# Patient Record
Sex: Female | Born: 1935 | Hispanic: Yes | Smoking: Never smoker
Health system: Southern US, Community
[De-identification: ages and names within clinical notes are randomized; demographics above are authoritative.]

## PROBLEM LIST (undated history)

## (undated) DIAGNOSIS — R51 Headache: Secondary | ICD-10-CM

## (undated) DIAGNOSIS — E785 Hyperlipidemia, unspecified: Secondary | ICD-10-CM

## (undated) DIAGNOSIS — I10 Essential (primary) hypertension: Secondary | ICD-10-CM

## (undated) DIAGNOSIS — M1990 Unspecified osteoarthritis, unspecified site: Secondary | ICD-10-CM

## (undated) HISTORY — DX: Unspecified osteoarthritis, unspecified site: M19.90

## (undated) HISTORY — PX: HUMERUS FRACTURE SURGERY: SHX670

## (undated) HISTORY — DX: Hyperlipidemia, unspecified: E78.5

## (undated) HISTORY — PX: CHOLECYSTECTOMY: SHX55

## (undated) HISTORY — PX: ABDOMINAL HYSTERECTOMY: SHX81

## (undated) HISTORY — DX: Essential (primary) hypertension: I10

## (undated) HISTORY — DX: Headache: R51

---

## 2009-05-18 ENCOUNTER — Emergency Department: Payer: Self-pay | Admitting: Emergency Medicine

## 2009-05-28 ENCOUNTER — Emergency Department: Payer: Self-pay | Admitting: Emergency Medicine

## 2011-10-31 ENCOUNTER — Encounter: Payer: Self-pay | Admitting: Internal Medicine

## 2011-10-31 ENCOUNTER — Ambulatory Visit (INDEPENDENT_AMBULATORY_CARE_PROVIDER_SITE_OTHER): Payer: Self-pay | Admitting: Internal Medicine

## 2011-10-31 DIAGNOSIS — R002 Palpitations: Secondary | ICD-10-CM

## 2011-10-31 DIAGNOSIS — R1032 Left lower quadrant pain: Secondary | ICD-10-CM

## 2011-10-31 DIAGNOSIS — E785 Hyperlipidemia, unspecified: Secondary | ICD-10-CM

## 2011-10-31 DIAGNOSIS — I10 Essential (primary) hypertension: Secondary | ICD-10-CM

## 2011-10-31 DIAGNOSIS — R42 Dizziness and giddiness: Secondary | ICD-10-CM

## 2011-10-31 DIAGNOSIS — Z0000 Encounter for general adult medical examination without abnormal findings: Secondary | ICD-10-CM

## 2011-10-31 LAB — COMPREHENSIVE METABOLIC PANEL
BUN: 15 mg/dL (ref 6–23)
CO2: 30 mEq/L (ref 19–32)
Calcium: 9.1 mg/dL (ref 8.4–10.5)
Chloride: 103 mEq/L (ref 96–112)
Creatinine, Ser: 0.7 mg/dL (ref 0.4–1.2)
GFR: 84.16 mL/min (ref >60.00)

## 2011-10-31 LAB — CBC WITH DIFFERENTIAL/PLATELET
Basophils Absolute: 0 10*3/uL (ref 0.0–0.1)
Eosinophils Relative: 0.6 % (ref 0.0–5.0)
MCV: 98.1 fl (ref 78.0–100.0)
Monocytes Absolute: 0.5 10*3/uL (ref 0.1–1.0)
Monocytes Relative: 7.7 % (ref 3.0–12.0)
Neutrophils Relative %: 55.7 % (ref 43.0–77.0)
Platelets: 274 10*3/uL (ref 150.0–400.0)
WBC: 6.2 10*3/uL (ref 4.5–10.5)

## 2011-10-31 LAB — LIPID PANEL: Cholesterol: 214 mg/dL — ABNORMAL HIGH (ref 0–200)

## 2011-10-31 LAB — TSH: TSH: 0.8 u[IU]/mL (ref 0.35–5.50)

## 2011-10-31 LAB — LDL CHOLESTEROL, DIRECT: Direct LDL: 148.8 mg/dL

## 2011-10-31 NOTE — Progress Notes (Signed)
Subjective:    Patient ID: Michele Rogers, female    DOB: April 26, 1935, 76 y.o.   MRN: [id number]  HPI  76YO female with history of hypertension presents to establish care. Note that history is obtained from her granddaughter translating. Patient speaks Spanish only. Patient reports that she has intermittent episodes of palpitations, dizziness, and chest pain. Chest pain is described as a pressure located in the mid chest. Dizziness is described as lightheadedness. These occur at rest and often at night. They have been occurring for months. They resolve without any intervention. She denies diaphoresis during these episodes. She denies any nausea during these episodes. She has not had any evaluation for this previously.  Patient also reports several month history of intermittent left lower abdominal pain. She reports that this is intermittent. It is not associated with diarrhea or nausea. She has not had any blood in her stool or black stools. She has never had a colonoscopy. Notably, her sister has colon cancer. She denies any fever or chills.   Patient also reports a long-standing history of hypertension for which he takes metoprolol and olmesartan. She has been obtaining these medications from [place]. She moved here from [place] 6 years ago but returns periodically and purchase her medications. She reports full compliance with these medicines. She notes that in the distant past she was also taking medicine for high cholesterol.   Outpatient Encounter Prescriptions as of 10/31/2011  Medication Sig Dispense Refill  . metoprolol (LOPRESSOR) 100 MG tablet Take 100 mg by mouth daily. 1/2 tablet daily        . olmesartan (BENICAR) 40 MG tablet Take 40 mg by mouth daily.           Review of Systems  Constitutional: Negative for fever, chills, appetite change, fatigue and unexpected weight change.  HENT: Negative for ear pain, congestion, sore throat, trouble swallowing, neck pain, voice change and sinus  pressure.   Eyes: Negative for visual disturbance.  Respiratory: Negative for cough, shortness of breath, wheezing and stridor.   Cardiovascular: Positive for chest pain and palpitations. Negative for leg swelling.  Gastrointestinal: Positive for abdominal pain (left lower quadrant). Negative for nausea, vomiting, diarrhea, constipation, blood in stool, abdominal distention and anal bleeding.  Genitourinary: Negative for dysuria and flank pain.  Musculoskeletal: Positive for back pain. Negative for myalgias, arthralgias and gait problem.  Skin: Negative for color change and rash.  Neurological: Positive for dizziness. Negative for headaches.  Hematological: Negative for adenopathy. Does not bruise/bleed easily.  Psychiatric/Behavioral: Negative for suicidal ideas, sleep disturbance and dysphoric mood. The patient is not nervous/anxious.    BP 138/82  Pulse 95  Temp(Src) 98 F (36.7 C) (Oral)  Resp 19  Ht 5' (1.524 m)  Wt 141 lb (63.957 kg)  BMI 27.54 kg/m2  SpO2 97%     Objective:   Physical Exam  Constitutional: She is oriented to person, place, and time. She appears well-developed and well-nourished. No distress.  HENT:  Head: Normocephalic and atraumatic.  Right Ear: External ear normal.  Left Ear: External ear normal.  Nose: Nose normal.  Mouth/Throat: Oropharynx is clear and moist. No oropharyngeal exudate.  Eyes: Conjunctivae are normal. Pupils are equal, round, and reactive to light. Right eye exhibits no discharge. Left eye exhibits no discharge. No scleral icterus.  Neck: Normal range of motion. Neck supple. No tracheal deviation present. No thyromegaly present.  Cardiovascular: Normal rate, regular rhythm, normal heart sounds and intact distal pulses.  Exam reveals no gallop and  no friction rub.   No murmur heard. Pulmonary/Chest: Effort normal and breath sounds normal. No respiratory distress. She has no wheezes. She has no rales. She exhibits no tenderness.    Abdominal: Soft. Bowel sounds are normal. She exhibits no distension and no mass. There is tenderness (left lower quadrant). There is no rebound and no guarding.  Musculoskeletal: Normal range of motion. She exhibits no edema and no tenderness.  Lymphadenopathy:    She has no cervical adenopathy.  Neurological: She is alert and oriented to person, place, and time. No cranial nerve deficit. She exhibits normal muscle tone. Coordination normal.  Skin: Skin is warm and dry. No rash noted. She is not diaphoretic. No erythema. No pallor.  Psychiatric: She has a normal mood and affect. Her behavior is normal. Judgment and thought content normal.          Assessment & Plan:  1. Chest pain -symptoms are concerning for angina. EKG today shows some nonspecific T-wave changes. I reviewed records from previous evaluation at [facility] in 2010, however evaluation was limited, at that point her troponin was negative and EKG was normal. I think she would benefit from further cardiac evaluation likely to include stress test. We will set her up with cardiology. I explained this to her granddaughter today.  2. Palpitations and dizziness - question if she may be having runs of atrial fibrillation. Will check TSH with labs. May ultimately need Holter monitor for additional evaluation.  3. Left lower quadrant pain -  Most consistent with episodes of diverticulitis. Would like to get imaging including CT scan, however she does not have health insurance. Her granddaughter reports that she is trying to establish health insurance. She will call us in this this is established and we can set up CT scan. Also, given her family history of colon cancer, she would benefit from colonoscopy. Again, we'll wait until health insurance establish to set this up. Symptoms are currently minimal, so will hold off on treatment with antibiotics. She will call or return to clinic if symptoms are worsening.  4.  Hypertension - BP well controlled today. Will check renal function with labs. Continue metoprolol and olmesartan.  5. Hyperlipidemia - Pt reports history of HL in past. Will check lipids with labs today.

## 2011-10-31 NOTE — Patient Instructions (Signed)
Diverticulitis (Diverticulitis)  Un divertculo es una pequea bolsa o saco en el colon. La diverticulosis es la presencia de divertculos en el colon. Es la irritacin (inflamacin) o infeccin de los divertculos.  CAUSAS Ciertos grmenes (bacterias) colonizan el colon y los divertculos. Si algunas partculas de alimento obstruyen la pequea abertura de un divertculo, las bacterias que existen en su interior se multiplican y causan un aumento de la presin. Esto produce la infeccin y la inflamacin denominada diverticulitis. SNTOMAS  Dolor y Associate Professor en el vientre (abdomen). Por lo general el dolor se ubica en la zona inferior izquierda del abdomen. Sin embargo, podra Sales promotion account executive.   Grant Ruts.   Hinchazn.   Ganas de vomitar (nuseas).   Vmitos.   Heces anormales.  DIAGNSTICO La historia clnica y el examen fsico ayudarn a diagnosticar la enfermedad. Ser necesario realizar otras pruebas ya que muchas cosas pueden causar dolor abdominal. Estas pruebas son:  Arna Snipe.   Pruebas de [place].   Radiografas de abdomen.   Tomografas computarizadas de abdomen.  En algunos casos se indicar ciruga para determinar si los sntomas se deben a diverticulitis o a otros trastornos. TRATAMIENTO En la mayor parte de los casos se trata sin [place]. El tratamiento incluye:  Reposo del intestino restringiendo la alimentacin slo a lquidos durante [employer]. Cuando mejore, deber seguir una dieta baja en fibras.   Si est deshidratado le administrarn lquidos por va endovenosa.   Le indicarn antibiticos para tratar la infeccin.   Medicamentos para Chief Technology Officer y las nuseas en caso de ser necesario.   Se indica la ciruga si el divertculo inflamado se ha perforado.  INSTRUCCIONES PARA EL CUIDADO DOMICILIARIO  Siga una dieta lquida (caldo, t o agua durante el tiempo que se lo indique su mdico). Podr gradualmente comenzar una dieta baja en fibras  segn lo que tolere. Una dieta baja en fibras es la que contiene menos de 10 gramos de [place]. Elija entre los alimentos a continuacin para reducir la cantidad de fibra de la dieta:   Pan blanco, cereales, arroz y pastas.   Nils Pyle y vegetales cocidos o frutas frescas blandas y vegetales sin piel.   Carne molida o un bife tierno bien cocido, jamn, ternera, cordero, cerdo o aves.   Huevos y frutos de mar.   Luego que los sntomas de la diverticulitis hayan mejorado, el mdico podr indicarle una dieta con ms cantidad [place]. Este tipo de dieta incluye 14 gramos de fibra cada 1000 caloras consumidas. Para una dieta estndar de 2000 caloras se necesitan 28 gramos de [place]. Siga las pautas para esta dieta para aumentar la ingesta de [place]. Es importante que aumente lentamente la cantidad de fibra para evitar los gases, la constipacin y la hinchazn.   Elija panes, cereales, pastas con salvado y Cristino Martes.   Elija frutas y vegetales frescos con su piel. No cocine demasiado los vegetales porque cunto ms los cocine, ms fibra pierden.   Consuma ms frutos secos, semillas, legumbres, arvejas secas, lentejas.   Busque en las etiquetas de Informacin Nutricional, los productos que tengan ms de 3 gramos de fibra por porcin.   Tome todos los medicamentos tal como se lo indic el profesional que lo asiste.   Si el mdico le ha dado fecha para una visita de control, es importante que concurra. No cumplir con este control puede dar como resultado que el dao, el dolor o la discapacidad sean permanentes(crnicos) . Si hay algn problema para  cumplir con los controles, comunquelo a su mdico.  SOLICITE ATENCIN MDICA SI:  El dolor no desaparece segn lo esperado.   No puede pasar de la dieta lquida.   Los movimientos intestinales no vuelven a la normalidad.  SOLICITE ATENCIN MDICA DE INMEDIATO SI:  El dolor se agrava.   Usted tiene una temperatura oral de ms de 102 F (38.9 C) y  no puede controlarla con medicamentos.   Presenta vmitos repetidas veces.   Observa la materia fecal tiene aspecto negro alquitranado o contiene sangre de color rojo brillante.   Si los sntomas que lo trajeron a Stage manager o no mejoran.  EST SEGURO QUE:   Comprende las instrucciones para el alta mdica.   Controlar su enfermedad.   Solicitar atencin mdica de inmediato segn las indicaciones.  Document Released: 09/24/2005 Document Revised: 08/27/2011 [facility] Patient Information 2012 [place], [place].

## 2011-11-05 ENCOUNTER — Telehealth: Payer: Self-pay | Admitting: Internal Medicine

## 2011-11-05 NOTE — Telephone Encounter (Signed)
There is a result note on labs, will call grand-daughter today.

## 2011-11-05 NOTE — Telephone Encounter (Signed)
Left mess to call office back, Noted this in result note, closing this phone note.

## 2011-11-06 ENCOUNTER — Telehealth: Payer: Self-pay | Admitting: Internal Medicine

## 2011-11-06 NOTE — Telephone Encounter (Signed)
Pt granddaughter returnded you call    [place] cell# [phone]

## 2011-11-06 NOTE — Telephone Encounter (Signed)
Called granddaughter and left VM - closing phone note, see result note.

## 2011-11-07 ENCOUNTER — Telehealth: Payer: Self-pay | Admitting: *Deleted

## 2011-11-07 MED ORDER — ATORVASTATIN CALCIUM 20 MG PO TABS: 20.0000 mg | ORAL_TABLET | Freq: Every day | ORAL | Status: DC

## 2011-11-07 NOTE — Telephone Encounter (Signed)
Message copied by Vernie Murders on Fri Nov 07, 2011  2:30 PM ------      Message from: Ronna Polio A      Created: Fri Oct 31, 2011  7:05 PM       Cholesterol is high. I would like to start Lipitor 20mg  daily. Disp 30 refill 6. Repeat lipids and LFTs in 1 month.  Note that pt speaks Spanish only so will need to use granddaughter to translate (she comes with her to visits)

## 2011-11-07 NOTE — Telephone Encounter (Signed)
Granddaughter informed

## 2011-11-17 ENCOUNTER — Encounter: Payer: Self-pay | Admitting: Cardiovascular Disease

## 2011-11-17 ENCOUNTER — Ambulatory Visit (INDEPENDENT_AMBULATORY_CARE_PROVIDER_SITE_OTHER): Payer: Self-pay | Admitting: Cardiovascular Disease

## 2011-11-17 ENCOUNTER — Encounter: Payer: Self-pay | Admitting: *Deleted

## 2011-11-17 DIAGNOSIS — R079 Chest pain, unspecified: Secondary | ICD-10-CM | POA: Insufficient documentation

## 2011-11-17 DIAGNOSIS — I10 Essential (primary) hypertension: Secondary | ICD-10-CM | POA: Insufficient documentation

## 2011-11-17 DIAGNOSIS — R0602 Shortness of breath: Secondary | ICD-10-CM

## 2011-11-17 DIAGNOSIS — R002 Palpitations: Secondary | ICD-10-CM

## 2011-11-17 DIAGNOSIS — R42 Dizziness and giddiness: Secondary | ICD-10-CM

## 2011-11-17 DIAGNOSIS — I152 Hypertension secondary to endocrine disorders: Secondary | ICD-10-CM | POA: Insufficient documentation

## 2011-11-17 MED ORDER — ASPIRIN 81 MG PO TABS: 81.0000 mg | ORAL_TABLET | Freq: Every day | ORAL | Status: AC

## 2011-11-17 MED ORDER — NITROGLYCERIN 0.4 MG SL SUBL: 0.4000 mg | SUBLINGUAL_TABLET | SUBLINGUAL | Status: DC | PRN

## 2011-11-17 NOTE — Assessment & Plan Note (Signed)
Her blood pressure is mildly elevated. I've asked her to watch her salt intake. We'll increase her metoprolol. We have refilled [facility] to [facility] on [address] Prior to this, she has been getting her medicines from [place].

## 2011-11-17 NOTE — Assessment & Plan Note (Signed)
The patient presents with symptoms that are consistent with unstable angina. She's having episodes of chest pain with exertion. These episodes radiate to her intrascapular region also to her left shoulder.  Her blood pressure and heart rate are little bit elevated. We will increase her metoprolol to 50 mg twice a day. We'll continue with the Benicar. We will have her start on aspirin 81 mg a day. We will also give her a prescription for nitroglycerin.  We'll schedule her for a [employer] study. I'll see her back in approximately one month.  She is to call us sooner for any problems.

## 2011-11-17 NOTE — Progress Notes (Signed)
Michele Rogers Date of Birth  Jun 19, 1935 [facility] [address]    [address] [place], [place]  [address] [phone]  Fax  [phone]  History of Present Illness:  A 76 year old female who speaks only Spanish. She was interviewed with the help of an interpreter.  She presents today for further evaluation of chest pain. These episodes of chest pain occur with exercise. They're associated with some shortness breath and extreme fatigue. No diaphoresis.  It radiates through to her back into the left side of her chest.  These have been present for a long time.  Current Outpatient Prescriptions on File Prior to Visit  Medication Sig Dispense Refill  . atorvastatin (LIPITOR) 20 MG tablet Take 1 tablet (20 mg total) by mouth daily.  30 tablet  6  . metoprolol (LOPRESSOR) 100 MG tablet Take 100 mg by mouth daily. 1/2 tablet daily        . olmesartan (BENICAR) 40 MG tablet Take 40 mg by mouth daily.          No Known Allergies  Past Medical History  Diagnosis Date  . Hypertension   . Hyperlipidemia   . Headache   . Arthritis   . Chest pain     Past Surgical History  Procedure Date  . Abdominal hysterectomy     complete  . Cholecystectomy   . Vaginal delivery     5    History  Smoking status  . Current Some Day Smoker  . Types: Cigarettes  Smokeless tobacco  . Never Used    History  Alcohol Use  . Yes    once a week    Family History  Problem Relation Age of Onset  . Cancer Sister     colon    Reviw of Systems:  Reviewed in the HPI.  All other systems are negative.  Physical Exam: BP 150/72  Pulse 82  Ht 5' (1.524 m)  Wt 142 lb 8 oz (64.638 kg)  BMI 27.83 kg/m2 The patient is alert and oriented x 3.  The mood and affect are normal.   Skin: warm and dry.  Color is normal.    HEENT:   [place]/ AT, no JVD, neck is supple  Lungs: clear   Heart: RR, there is a soft systolic murmur    Abdomen: soft , + bs, no HSM   Extremities:  No c/c/e  Neuro:   Non focal    ECG: From medical doctors office - NSR  Assessment / Plan:

## 2011-11-17 NOTE — Patient Instructions (Addendum)
Your physician has recommended you make the following change in your medication: START Nitroglycerin 0.4mg  Sublingual AS NEEDED (instructions will be on prescription details). START Aspirin 81mg  once daily.  Your physician has requested that you have en exercise stress myoview. For further information please visit [email or web address] Please follow instruction sheet, as given.  Your physician recommends that you schedule a follow-up appointment in: 1 month with Dr. Elease Hashimoto       Su doctor ha recommendado que usted haga estos cambios ha sus medicamentos: Empiese tomar Nitroglycerlin 0.4mg  abajo de la lengua cuando usted lo necesita ( las instruciones estaran en los detalles de prescripcion) Empiese tomar Aspirina de 81 mg una ves por dia.  Su doctor has pedido [employer] hagan un examen de Radiographer, therapeutic( exercise stress myoview). Por mas informacion porfavor visita [email or web address] [employer] siege las instruciones en el formulario como se lo dan.    Su doctor recomiende que usted haga una cita en un mes: para que el Dr Andree Coss lo puede reexaminar.

## 2011-11-27 ENCOUNTER — Ambulatory Visit: Payer: Self-pay

## 2011-11-27 ENCOUNTER — Encounter: Payer: Self-pay | Admitting: Cardiovascular Disease

## 2011-11-27 DIAGNOSIS — R079 Chest pain, unspecified: Secondary | ICD-10-CM

## 2011-11-28 ENCOUNTER — Telehealth: Payer: Self-pay | Admitting: *Deleted

## 2011-11-28 ENCOUNTER — Ambulatory Visit: Payer: Self-pay | Admitting: Internal Medicine

## 2011-11-28 NOTE — Telephone Encounter (Signed)
Attempted to contact pt, LMOM TCB. Per Dr. Mariah Milling stress myoview was normal, will notify pt when returns call.

## 2011-11-28 NOTE — Telephone Encounter (Signed)
Pt notified of results. She will f/u with Dr. Elease Hashimoto in 1 month.

## 2011-12-01 ENCOUNTER — Encounter: Payer: Self-pay | Admitting: Internal Medicine

## 2011-12-01 ENCOUNTER — Ambulatory Visit (INDEPENDENT_AMBULATORY_CARE_PROVIDER_SITE_OTHER): Payer: Self-pay | Admitting: Internal Medicine

## 2011-12-01 VITALS — BP 132/60 | HR 86 | Temp 99.2°F | Wt 141.0 lb

## 2011-12-01 DIAGNOSIS — I10 Essential (primary) hypertension: Secondary | ICD-10-CM

## 2011-12-01 DIAGNOSIS — Z23 Encounter for immunization: Secondary | ICD-10-CM

## 2011-12-01 DIAGNOSIS — E785 Hyperlipidemia, unspecified: Secondary | ICD-10-CM

## 2011-12-01 LAB — COMPREHENSIVE METABOLIC PANEL
AST: 22 U/L (ref 0–37)
Albumin: 3.8 g/dL (ref 3.5–5.2)
Alkaline Phosphatase: 91 U/L (ref 39–117)
Glucose, Bld: 147 mg/dL — ABNORMAL HIGH (ref 70–99)
Potassium: 4.5 mEq/L (ref 3.5–5.1)
Sodium: 141 mEq/L (ref 135–145)
Total Protein: 7.6 g/dL (ref 6.0–8.3)

## 2011-12-01 LAB — LIPID PANEL
LDL Cholesterol: 67 mg/dL (ref 0–99)
Total CHOL/HDL Ratio: 3

## 2011-12-01 NOTE — Progress Notes (Signed)
Subjective:    Patient ID: Michele Rogers, female    DOB: November 23, 1935, 76 y.o.   MRN: [id number]  HPI 76 year old female with a history of hypertension and hyperlipidemia presents for followup. She reports through her granddaughter who is interpreting, that she is feeling well. She denies any recurrent episodes of chest pain. Her granddaughter reports that recent stress test was normal and they were called with results of this. She reports full compliance with her medications including metoprolol, Benicar, and atorvastatin. She denies any complications from these medicines. She has no new complaints today.  Outpatient Encounter Prescriptions as of 12/01/2011  Medication Sig Dispense Refill  . aspirin 81 MG tablet Take 1 tablet (81 mg total) by mouth daily.  30 tablet  0  . atorvastatin (LIPITOR) 20 MG tablet Take 1 tablet (20 mg total) by mouth daily.  30 tablet  6  . metoprolol (LOPRESSOR) 100 MG tablet Take 50 mg by mouth 2 (two) times daily.       . nitroGLYCERIN (NITROSTAT) 0.4 MG SL tablet Place 1 tablet (0.4 mg total) under the tongue every 5 (five) minutes as needed for chest pain.  100 tablet  3  . olmesartan (BENICAR) 40 MG tablet Take 40 mg by mouth daily.           Review of Systems  Constitutional: Negative for fever, chills, appetite change, fatigue and unexpected weight change.  HENT: Negative for ear pain, congestion, sore throat, trouble swallowing, neck pain, voice change and sinus pressure.   Eyes: Negative for visual disturbance.  Respiratory: Negative for cough, shortness of breath, wheezing and stridor.   Cardiovascular: Negative for chest pain, palpitations and leg swelling.  Gastrointestinal: Negative for nausea, vomiting, abdominal pain, diarrhea, constipation, blood in stool, abdominal distention and anal bleeding.  Genitourinary: Negative for dysuria and flank pain.  Musculoskeletal: Negative for myalgias, arthralgias and gait problem.  Skin: Negative for color change  and rash.  Neurological: Negative for dizziness and headaches.  Hematological: Negative for adenopathy. Does not bruise/bleed easily.  Psychiatric/Behavioral: Negative for suicidal ideas, sleep disturbance and dysphoric mood. The patient is not nervous/anxious.    BP 132/60  Pulse 86  Temp(Src) 99.2 F (37.3 C) (Oral)  Wt 141 lb (63.957 kg)  SpO2 98%     Objective:   Physical Exam  Constitutional: She is oriented to person, place, and time. She appears well-developed and well-nourished. No distress.  HENT:  Head: Normocephalic and atraumatic.  Right Ear: External ear normal.  Left Ear: External ear normal.  Nose: Nose normal.  Mouth/Throat: Oropharynx is clear and moist. No oropharyngeal exudate.  Eyes: Conjunctivae are normal. Pupils are equal, round, and reactive to light. Right eye exhibits no discharge. Left eye exhibits no discharge. No scleral icterus.  Neck: Normal range of motion. Neck supple. No tracheal deviation present. No thyromegaly present.  Cardiovascular: Normal rate, regular rhythm, normal heart sounds and intact distal pulses.  Exam reveals no gallop and no friction rub.   No murmur heard. Pulmonary/Chest: Effort normal and breath sounds normal. No respiratory distress. She has no wheezes. She has no rales. She exhibits no tenderness.  Musculoskeletal: Normal range of motion. She exhibits no edema and no tenderness.  Lymphadenopathy:    She has no cervical adenopathy.  Neurological: She is alert and oriented to person, place, and time. No cranial nerve deficit. She exhibits normal muscle tone. Coordination normal.  Skin: Skin is warm and dry. No rash noted. She is not diaphoretic. No erythema. No  pallor.  Psychiatric: She has a normal mood and affect. Her behavior is normal. Judgment and thought content normal.          Assessment & Plan:  1. Hypertension - BP well controlled. Renal function normal 10/2011. Continue benicar and metoprolol.  2.  Hyperlipidemia - Lipids elevated on last check. Lipitor was started. Will recheck lipids and LFTs today. Follow up 6 months.  3. Chest pain - resolved. Per pt, stress test was normal. Will get results from cardiology.

## 2011-12-05 ENCOUNTER — Encounter: Payer: Self-pay | Admitting: Cardiovascular Disease

## 2011-12-26 ENCOUNTER — Encounter: Payer: Self-pay | Admitting: Cardiovascular Disease

## 2011-12-26 ENCOUNTER — Ambulatory Visit (INDEPENDENT_AMBULATORY_CARE_PROVIDER_SITE_OTHER): Payer: Self-pay | Admitting: Cardiovascular Disease

## 2011-12-26 VITALS — BP 140/66 | HR 71 | Ht 60.0 in | Wt 142.0 lb

## 2011-12-26 DIAGNOSIS — I10 Essential (primary) hypertension: Secondary | ICD-10-CM

## 2011-12-26 MED ORDER — OLMESARTAN MEDOXOMIL 40 MG PO TABS: 40.0000 mg | ORAL_TABLET | Freq: Every day | ORAL | Status: DC

## 2011-12-26 NOTE — Assessment & Plan Note (Signed)
Her blood pressure is clearly better but it is still a little elevated. We will increase her Benicar to 40 mg a day. I'll see her in 2 months for an office visit and basic metabolic profile.  She's tolerating her higher dose of metoprolol.   She has cut out most of her salt.

## 2011-12-26 NOTE — Progress Notes (Signed)
    Michele Rogers Date of Birth  September 13, 1935 [facility]     [place] Office  [address]    [address]   [address] [place], [place]  [address]    [place], [place]  [address] [phone]  Fax  [phone]  [phone]  Fax [phone]   History of Present Illness:    Current Outpatient Prescriptions on File Prior to Visit  Medication Sig Dispense Refill  . aspirin 81 MG tablet Take 1 tablet (81 mg total) by mouth daily.  30 tablet  0  . atorvastatin (LIPITOR) 20 MG tablet Take 1 tablet (20 mg total) by mouth daily.  30 tablet  6  . metoprolol (LOPRESSOR) 100 MG tablet Take 50 mg by mouth 2 (two) times daily.         No Known Allergies  Past Medical History  Diagnosis Date  . Hypertension   . Hyperlipidemia   . Headache   . Arthritis   . Chest pain     Past Surgical History  Procedure Date  . Abdominal hysterectomy     complete  . Cholecystectomy   . Vaginal delivery     5    History  Smoking status  . Never Smoker   Smokeless tobacco  . Never Used    History  Alcohol Use  . Yes    once a week    Family History  Problem Relation Age of Onset  . Cancer Sister     colon    Reviw of Systems:  Reviewed in the HPI.  All other systems are negative.  Physical Exam: BP 140/66  Pulse 71  Ht 5' (1.524 m)  Wt 142 lb (64.411 kg)  BMI 27.73 kg/m2  SpO2 98% The patient is alert and oriented x 3.  The mood and affect are normal.   Skin: warm and dry.  Color is normal.    HEENT:   [place]/AT,  No JVD, normal carotids  Lungs: clear   Heart: RR, no murmurs    Abdomen: non tender, +BS  Extremities:  No c/c/e  Neuro:  CN intact, gait is normal    ECG:  Assessment / Plan:

## 2011-12-26 NOTE — Patient Instructions (Addendum)
Your physician has recommended you make the following change in your medication: INCREASE Benicar to 40mg  daily.   Your physician recommends that you schedule a follow-up appointment in: 2 months with Dr. Elease Hashimoto  Your physician recommends that you return for lab work in: BMP (at next office visit)   tu doctor a recomendado los siguientes cambios en su medicina AUMENTAR [employer] A 40 MG DIARIAMENTE (una pastilla entera)  Tambien que hagas una cita de seguimiento con el doctor Nahser En tu proxima visita necesitas hacerte un examen de Retail buyer .

## 2012-01-05 ENCOUNTER — Other Ambulatory Visit: Payer: Self-pay

## 2012-02-19 ENCOUNTER — Telehealth: Payer: Self-pay | Admitting: *Deleted

## 2012-02-19 NOTE — Telephone Encounter (Signed)
Granddaughter is req a call back regarding RF needed.

## 2012-02-20 ENCOUNTER — Other Ambulatory Visit: Payer: Self-pay | Admitting: *Deleted

## 2012-02-20 ENCOUNTER — Telehealth: Payer: Self-pay | Admitting: Internal Medicine

## 2012-02-20 DIAGNOSIS — I10 Essential (primary) hypertension: Secondary | ICD-10-CM

## 2012-02-20 MED ORDER — METOPROLOL TARTRATE 100 MG PO TABS: 50.0000 mg | ORAL_TABLET | Freq: Two times a day (BID) | ORAL | Status: DC

## 2012-02-20 MED ORDER — OLMESARTAN MEDOXOMIL 40 MG PO TABS: 40.0000 mg | ORAL_TABLET | Freq: Every day | ORAL | Status: DC

## 2012-02-20 NOTE — Telephone Encounter (Signed)
[phone] Pt granddaughter called  Needs refill on bp meds  Granddaughter was not sure what the meds were[facility]

## 2012-02-20 NOTE — Telephone Encounter (Signed)
ERROR, wrong chart

## 2012-02-20 NOTE — Telephone Encounter (Signed)
Rf's sent in

## 2012-02-23 ENCOUNTER — Telehealth: Payer: Self-pay | Admitting: Internal Medicine

## 2012-02-23 NOTE — Telephone Encounter (Signed)
Spoke w/granddaughter. Explained Rx for Benicar is prob the expensive one (especially for 3 mth supply) pt has apt w/cardiologist tomorrow and will ask for samples as we have none to give at this time. Pt is waiting on medicaid approval.

## 2012-02-24 ENCOUNTER — Encounter: Payer: Self-pay | Admitting: Cardiovascular Disease

## 2012-02-24 ENCOUNTER — Ambulatory Visit (INDEPENDENT_AMBULATORY_CARE_PROVIDER_SITE_OTHER): Payer: Self-pay | Admitting: Cardiovascular Disease

## 2012-02-24 ENCOUNTER — Ambulatory Visit: Payer: Self-pay

## 2012-02-24 VITALS — BP 140/72 | HR 77 | Ht 62.0 in | Wt 143.0 lb

## 2012-02-24 DIAGNOSIS — R000 Tachycardia, unspecified: Secondary | ICD-10-CM

## 2012-02-24 DIAGNOSIS — I10 Essential (primary) hypertension: Secondary | ICD-10-CM

## 2012-02-24 MED ORDER — LOSARTAN POTASSIUM 100 MG PO TABS: 100.0000 mg | ORAL_TABLET | Freq: Every day | ORAL | Status: DC

## 2012-02-24 NOTE — Assessment & Plan Note (Signed)
Michele Rogers has not had any blood pressure medicine 4 days. She was unable to fill the Benicar locally because of cost. We will discontinue the Benicar and start her on losartan 100 mg a day. We'll see her back in 3 months for an office visit and fasting labs.

## 2012-02-24 NOTE — Patient Instructions (Signed)
Your physician recommends that you schedule a follow-up appointment in: 3 MONTHS WITH DR. Elease Hashimoto IN THE [facility] OFFICE  Your physician recommends that you return for lab work in: 3 MONTHS FASTING LIVER/LIPID PANEL AND BMET SAME DAY  Your physician has recommended you make the following change in your medication: STOP BENICAR; START LOSARTAN 100 MG 1 TABLET DAILY WAS SENT IN FOR YOU TODAY TO [facility] [address]

## 2012-02-24 NOTE — Progress Notes (Signed)
    Michele Rogers Date of Birth  1935-12-19 [facility]     [place] Office  [address]    [address]   [address] [place], [place]  [address]    [place], [place]  [address] [phone]  Fax  [phone]  [phone]  Fax [phone]   History of Present Illness:  Michele Rogers is a 77 yo female from [place] who is seen today with a family member.  She ran out of her meds around 4 days ago.  She was previously getting her medications from a relative in [place] but during her last office visit we wrote her prescription. She was unable to fill the prescription because of the high cost.  She was previously getting the Benicar for [place] in a cluster $40 a. She was told by the pharmacist that her medication Michele Rogers would be to $500.  Current Outpatient Prescriptions on File Prior to Visit  Medication Sig Dispense Refill  . aspirin 81 MG tablet Take 1 tablet (81 mg total) by mouth daily.  30 tablet  0  . atorvastatin (LIPITOR) 20 MG tablet Take 1 tablet (20 mg total) by mouth daily.  30 tablet  6  . metoprolol (LOPRESSOR) 100 MG tablet Take 0.5 tablets (50 mg total) by mouth 2 (two) times daily.  90 tablet  1  . olmesartan (BENICAR) 40 MG tablet Take 1 tablet (40 mg total) by mouth daily.  90 tablet  1    No Known Allergies  Past Medical History  Diagnosis Date  . Hypertension   . Hyperlipidemia   . Headache   . Arthritis   . Chest pain     Past Surgical History  Procedure Date  . Abdominal hysterectomy     complete  . Cholecystectomy   . Vaginal delivery     5    History  Smoking status  . Never Smoker   Smokeless tobacco  . Never Used    History  Alcohol Use  . Yes    once a week    Family History  Problem Relation Age of Onset  . Cancer Sister     colon    Reviw of Systems:  Reviewed in the HPI.  All other systems are negative.  Physical Exam: BP 140/72  Pulse 77  Ht 5\' 2"  (1.575 m)  Wt 143 lb (64.864 kg)  BMI 26.15 kg/m2 The patient is  alert and oriented x 3.  The mood and affect are normal.   Skin: warm and dry.  Color is normal.    HEENT:   [place]/AT,  No JVD, normal carotids.  Her tongue has deep cracks and crevices.  No signs of infection.  Not bleeding  Lungs: clear   Heart: RR, no murmurs    Abdomen: non tender, +BS  Extremities:  No c/c/e  Neuro:  CN intact, gait is normal    ECG: normal sinus rhythm. Normal EKG. There are no changes from her previous tracing.  Assessment / Plan:

## 2012-02-24 NOTE — Assessment & Plan Note (Signed)
We discussed the possibility of changing her atorvastatin simvastatin which would be less expensive. The daughter stated that she wanted to stay with atorvastatin for now. We'll see her back in 3 months.

## 2012-02-25 LAB — BASIC METABOLIC PANEL
BUN: 18 mg/dL (ref 8–27)
CO2: 22 mmol/L (ref 20–32)
Chloride: 102 mmol/L (ref 97–108)
Creatinine, Ser: 0.65 mg/dL (ref 0.57–1.00)
GFR calc Af Amer: 101 mL/min/{1.73_m2} (ref >59)
Glucose: 109 mg/dL — ABNORMAL HIGH (ref 65–99)

## 2012-04-20 ENCOUNTER — Inpatient Hospital Stay: Payer: Self-pay | Admitting: Unknown Physician Specialty

## 2012-04-20 LAB — CBC WITH DIFFERENTIAL/PLATELET
Basophil #: 0.1 10*3/uL (ref 0.0–0.1)
Basophil %: 0.6 %
Eosinophil #: 0.1 10*3/uL (ref 0.0–0.7)
Eosinophil %: 0.7 %
HCT: 36.8 % (ref 35.0–47.0)
HGB: 12.3 g/dL (ref 12.0–16.0)
MCHC: 33.4 g/dL (ref 32.0–36.0)
Monocyte #: 0.4 x10 3/mm (ref 0.2–0.9)
Monocyte %: 4.3 %
Platelet: 216 10*3/uL (ref 150–440)
RDW: 12.9 % (ref 11.5–14.5)

## 2012-04-20 LAB — PROTIME-INR
INR: 0.8
Prothrombin Time: 11.6 secs (ref 11.5–14.7)

## 2012-04-20 LAB — COMPREHENSIVE METABOLIC PANEL
Albumin: 3.7 g/dL (ref 3.4–5.0)
Anion Gap: 8 (ref 7–16)
Bilirubin,Total: 1.3 mg/dL — ABNORMAL HIGH (ref 0.2–1.0)
Calcium, Total: 8.6 mg/dL (ref 8.5–10.1)
Chloride: 103 mmol/L (ref 98–107)
Co2: 30 mmol/L (ref 21–32)
Creatinine: 0.85 mg/dL (ref 0.60–1.30)
EGFR (African American): >60
Potassium: 4.1 mmol/L (ref 3.5–5.1)
Sodium: 141 mmol/L (ref 136–145)

## 2012-04-20 LAB — CK-MB: CK-MB: 1.4 ng/mL (ref 0.5–3.6)

## 2012-04-20 LAB — TROPONIN I: Troponin-I: <0.02 ng/mL

## 2012-04-20 LAB — CK: CK, Total: 106 U/L (ref 21–215)

## 2012-04-21 DIAGNOSIS — E789 Disorder of lipoprotein metabolism, unspecified: Secondary | ICD-10-CM

## 2012-04-21 DIAGNOSIS — Z01810 Encounter for preprocedural cardiovascular examination: Secondary | ICD-10-CM

## 2012-04-21 DIAGNOSIS — I10 Essential (primary) hypertension: Secondary | ICD-10-CM

## 2012-04-21 LAB — TROPONIN I
Troponin-I: <0.02 ng/mL
Troponin-I: <0.02 ng/mL

## 2012-04-21 LAB — CK TOTAL AND CKMB (NOT AT ARMC): CK, Total: 153 U/L (ref 21–215)

## 2012-04-23 LAB — CBC WITH DIFFERENTIAL/PLATELET
Basophil #: 0 10*3/uL (ref 0.0–0.1)
Eosinophil #: 0 10*3/uL (ref 0.0–0.7)
HCT: 36.4 % (ref 35.0–47.0)
HGB: 12 g/dL (ref 12.0–16.0)
Lymphocyte #: 1.7 10*3/uL (ref 1.0–3.6)
Lymphocyte %: 15.9 %
MCHC: 33 g/dL (ref 32.0–36.0)
Monocyte #: 0.8 x10 3/mm (ref 0.2–0.9)
Neutrophil #: 8.1 10*3/uL — ABNORMAL HIGH (ref 1.4–6.5)
Neutrophil %: 76.4 %

## 2012-05-04 ENCOUNTER — Telehealth: Payer: Self-pay | Admitting: *Deleted

## 2012-05-04 NOTE — Telephone Encounter (Signed)
Dr Elease Hashimoto request for prior authorization for losartan 100 mg has been denied through medicaid. They/ medicaid,  want to know why an ACE cant be prescribed. Please advise

## 2012-05-05 ENCOUNTER — Other Ambulatory Visit: Payer: Self-pay | Admitting: *Deleted

## 2012-05-05 MED ORDER — LISINOPRIL 20 MG PO TABS: 20.0000 mg | ORAL_TABLET | Freq: Every day | ORAL | Status: DC

## 2012-05-05 NOTE — Telephone Encounter (Signed)
Called grand daughter laura and had her write down meds, pt ins wont pay for losartan, will pay for ace inhib, new script sent

## 2012-05-05 NOTE — Telephone Encounter (Signed)
New med ordered, pt informed, see note

## 2012-05-05 NOTE — Telephone Encounter (Signed)
She came from [place] on [place].  She does not speak english.   She may be able to take Lisinopril.  Losartan is generic and I'm confused as to why it needs pre - auth.

## 2012-05-13 ENCOUNTER — Telehealth: Payer: Self-pay

## 2012-05-13 NOTE — Telephone Encounter (Signed)
I agree.  Restart Lostatan.   allergy to Lisinopril

## 2012-05-13 NOTE — Telephone Encounter (Signed)
LMOM to make sure she does take only the losartan and this will be addressed at office visit on May 25, 2012.

## 2012-05-13 NOTE — Telephone Encounter (Signed)
Vernona Rieger (grandaughter) called regarding patients medication lisinopril causing a cough. The patient will stop the lisinopril and start back on the losartan that was not covered when Rx first given to the patient but now the losartan is covered. Please advise if you have any further recommendation. She has a follow up appointment on May 25, 2012 with a lab prior.

## 2012-05-25 ENCOUNTER — Ambulatory Visit (INDEPENDENT_AMBULATORY_CARE_PROVIDER_SITE_OTHER): Payer: Medicaid Other | Admitting: Cardiovascular Disease

## 2012-05-25 ENCOUNTER — Ambulatory Visit (INDEPENDENT_AMBULATORY_CARE_PROVIDER_SITE_OTHER): Payer: Medicaid Other

## 2012-05-25 ENCOUNTER — Encounter: Payer: Self-pay | Admitting: Cardiovascular Disease

## 2012-05-25 VITALS — BP 120/78 | HR 86 | Ht 62.0 in | Wt 138.0 lb

## 2012-05-25 DIAGNOSIS — I10 Essential (primary) hypertension: Secondary | ICD-10-CM

## 2012-05-25 DIAGNOSIS — E785 Hyperlipidemia, unspecified: Secondary | ICD-10-CM

## 2012-05-25 NOTE — Patient Instructions (Signed)
Follow up in 6 months. Continue the same medications.

## 2012-05-25 NOTE — Progress Notes (Signed)
     Michele Rogers Date of Birth  March 05, 1935 [facility]     [place] Office  [address]    [address]   [address] [place], [place]  [address]    [place], [place]  [address] [phone]  Fax  [phone]  [phone]  Fax [phone]  Problem List: 1. Hypertension  History of Present Illness:  Michele Rogers is a 77 yo female from [place] who is seen today with her granddaughter.  They are saddened today due to the death of her  [name] ( granddaughter's brother)  He was found dead in his bed yesterday.  She's done very well on the Losartan. Initially she was having some difficulty in getting Medicaid to improve the Losartan but she developed a cough with the lisinopril and they ultimately have approved the losartan.  Her blood pressures been doing well. She's been avoiding extra salt.  Current Outpatient Prescriptions on File Prior to Visit  Medication Sig Dispense Refill  . aspirin 81 MG tablet Take 1 tablet (81 mg total) by mouth daily.  30 tablet  0  . atorvastatin (LIPITOR) 20 MG tablet Take 1 tablet (20 mg total) by mouth daily.  30 tablet  6  . losartan (COZAAR) 100 MG tablet Take 1 tablet (100 mg total) by mouth daily.  30 tablet  0  . metoprolol (LOPRESSOR) 100 MG tablet Take 0.5 tablets (50 mg total) by mouth 2 (two) times daily.  90 tablet  1    Allergies  Allergen Reactions  . Lisinopril Cough    Past Medical History  Diagnosis Date  . Hypertension   . Hyperlipidemia   . Headache   . Arthritis   . Chest pain     Past Surgical History  Procedure Date  . Abdominal hysterectomy     complete  . Cholecystectomy   . Vaginal delivery     5    History  Smoking status  . Never Smoker   Smokeless tobacco  . Never Used    History  Alcohol Use  . Yes    once a week    Family History  Problem Relation Age of Onset  . Cancer Sister     colon    Reviw of Systems:  Reviewed in the HPI.  All other systems are negative.  Physical  Exam: BP 120/78  Pulse 86  Ht 5\' 2"  (1.575 m)  Wt 138 lb (62.596 kg)  BMI 25.24 kg/m2 The patient is alert and oriented x 3.  The mood and affect are normal.   Skin: warm and dry.  Color is normal.    HEENT:   [place]/AT,  No JVD, normal carotids.   Lungs: clear to auscultation.  Heart: RR, no murmurs    Abdomen: non tender, +BS  Extremities:  No c/c/e.  She has a splint on her left wrist from a wrist fracture  Neuro:  CN intact, gait is normal    ECG: normal sinus rhythm. Normal EKG. There are no changes from her previous tracing.   Laboratory work including a basic metabolic profile, hepatic profile, and lipid profile are pending. Assessment / Plan:

## 2012-05-25 NOTE — Assessment & Plan Note (Signed)
She's tolerating her medicines quite well. Her blood pressure is normal. We'll continue with her same medications. I'll see her again in 6 months.

## 2012-05-25 NOTE — Assessment & Plan Note (Signed)
Lipid levels have been well controlled. We'll draw fasting lipids today. She's tolerating her atorvastatin.

## 2012-05-26 LAB — LIPID PANEL
Cholesterol, Total: 163 mg/dL (ref 100–199)
LDL Calculated: 79 mg/dL (ref 0–99)

## 2012-05-26 LAB — HEPATIC FUNCTION PANEL
ALT: 16 IU/L (ref 0–32)
AST: 25 IU/L (ref 0–40)
Alkaline Phosphatase: 117 IU/L (ref 25–165)
Bilirubin, Direct: 0.43 mg/dL — ABNORMAL HIGH (ref 0.00–0.40)

## 2012-05-26 LAB — BASIC METABOLIC PANEL
BUN/Creatinine Ratio: 20 (ref 11–26)
BUN: 16 mg/dL (ref 8–27)
CO2: 20 mmol/L (ref 20–32)
Creatinine, Ser: 0.82 mg/dL (ref 0.57–1.00)
GFR calc Af Amer: 82 mL/min/{1.73_m2} (ref >59)
GFR calc non Af Amer: 71 mL/min/{1.73_m2} (ref >59)

## 2012-06-04 ENCOUNTER — Ambulatory Visit: Payer: Self-pay | Admitting: Internal Medicine

## 2012-06-08 ENCOUNTER — Other Ambulatory Visit: Payer: Self-pay | Admitting: Internal Medicine

## 2012-06-08 NOTE — Telephone Encounter (Signed)
Needs a follow up

## 2012-09-02 ENCOUNTER — Other Ambulatory Visit: Payer: Self-pay | Admitting: Internal Medicine

## 2012-10-04 ENCOUNTER — Telehealth: Payer: Self-pay | Admitting: Internal Medicine

## 2012-10-04 NOTE — Telephone Encounter (Signed)
Pt granddaughter called . Ms pivonka will be leaving the country nov 9 and won't be back unitl January 4 and wanted to know what she can do about her med refill until then  [employer]

## 2012-10-06 MED ORDER — LOSARTAN POTASSIUM 100 MG PO TABS: 100.0000 mg | ORAL_TABLET | Freq: Every day | ORAL | Status: DC

## 2012-10-06 MED ORDER — METOPROLOL TARTRATE 100 MG PO TABS: 50.0000 mg | ORAL_TABLET | Freq: Two times a day (BID) | ORAL | Status: DC

## 2012-10-06 MED ORDER — ATORVASTATIN CALCIUM 40 MG PO TABS: 20.0000 mg | ORAL_TABLET | Freq: Every day | ORAL | Status: DC

## 2012-10-06 NOTE — Telephone Encounter (Signed)
Rx's sent to pharmacy for a 90 day supply, patient grand daughter advised via telephone.

## 2013-01-10 ENCOUNTER — Other Ambulatory Visit: Payer: Self-pay | Admitting: Internal Medicine

## 2013-02-03 ENCOUNTER — Ambulatory Visit (INDEPENDENT_AMBULATORY_CARE_PROVIDER_SITE_OTHER): Payer: Medicaid Other | Admitting: Cardiovascular Disease

## 2013-02-03 ENCOUNTER — Encounter: Payer: Self-pay | Admitting: Cardiovascular Disease

## 2013-02-03 VITALS — BP 180/84 | HR 63 | Ht 62.0 in | Wt 137.8 lb

## 2013-02-03 DIAGNOSIS — I10 Essential (primary) hypertension: Secondary | ICD-10-CM

## 2013-02-03 NOTE — Patient Instructions (Addendum)
Your physician wants you to follow-up in: 3 months with Dr. Elease Hashimoto. You will receive a reminder letter in the mail two months in advance. If you don't receive a letter, please call our office to schedule the follow-up appointment.  Monitor your blood pressure at home and call us with results.

## 2013-02-03 NOTE — Assessment & Plan Note (Signed)
Michele Rogers presents today with some blood pressure elevations. Her blood pressure was normal at her last visit. She states that she gets nervous when she comes to the Dr.  She has not been eating a lot of excessive salt.  We will see her in 3 months. I've asked her to take her blood pressure several times a week and record these and a blood pressure log. If her blood pressure readings are okay then we'll continue with her same medications. If we see significant elevations we will add some additional medications-either HCTZ/ potassium  or perhaps amlodipine.

## 2013-02-03 NOTE — Progress Notes (Addendum)
      [name] Date of Birth  1935/03/05 [facility]     [place] Office  [address]    [address]   [address] [place], [place]  [address]    [place], [place]  [address] [phone]  Fax  [phone]  [phone]  Fax [phone]  Problem List: 1. Hypertension  History of Present Illness:  Michele Rogers is a 78 yo female from [place] who is seen today with her granddaughter.  They are saddened today due to the death of her  [name] ( granddaughter's brother)  He was found dead in his bed yesterday.  She's done very well on the Losartan. Initially she was having some difficulty in getting Medicaid to improve the Losartan but she developed a cough with the lisinopril and they ultimately have approved the losartan.  Her blood pressures been doing well. She's been avoiding extra salt.  Current Outpatient Prescriptions on File Prior to Visit  Medication Sig Dispense Refill  . atorvastatin (LIPITOR) 40 MG tablet Take 0.5 tablets (20 mg total) by mouth daily.  45 tablet  0  . losartan (COZAAR) 100 MG tablet TAKE 1 TABLET BY MOUTH DAILY.  90 tablet  0  . metoprolol (LOPRESSOR) 100 MG tablet Take 0.5 tablets (50 mg total) by mouth 2 (two) times daily.  90 tablet  0    Allergies  Allergen Reactions  . Lisinopril Cough    Past Medical History  Diagnosis Date  . Hypertension   . Hyperlipidemia   . Headache   . Arthritis   . Chest pain     Past Surgical History  Procedure Date  . Abdominal hysterectomy     complete  . Cholecystectomy   . Vaginal delivery     5    History  Smoking status  . Never Smoker   Smokeless tobacco  . Never Used    History  Alcohol Use  . Yes    Comment: once a week    Family History  Problem Relation Age of Onset  . Cancer Sister     colon    Reviw of Systems:  Reviewed in the HPI.  All other systems are negative.  Physical Exam: BP 180/84  Pulse 63  Ht 5\' 2"  (1.575 m)  Wt 137 lb 12 oz (62.483 kg)  BMI  25.19 kg/m2 The patient is alert and oriented x 3.  The mood and affect are normal.   Skin: warm and dry.  Color is normal.    HEENT:   [place]/AT,  No JVD, normal carotids.   Lungs: clear to auscultation.  Heart: RR, no murmurs    Abdomen: non tender, +BS  Extremities:  No c/c/e.  She has a splint on her left wrist from a wrist fracture  Neuro:  CN intact, gait is normal    ECG: Debra 6, 2014: normal sinus rhythm at 63.Marland Kitchen Normal EKG. There are no changes from her previous tracing.   Laboratory work including a basic metabolic profile, hepatic profile, and lipid profile are pending. Assessment / Plan:

## 2013-03-01 ENCOUNTER — Other Ambulatory Visit: Payer: Self-pay | Admitting: Internal Medicine

## 2013-04-08 ENCOUNTER — Other Ambulatory Visit: Payer: Self-pay | Admitting: Internal Medicine

## 2013-04-08 NOTE — Telephone Encounter (Signed)
Eprescribed.

## 2013-04-18 ENCOUNTER — Other Ambulatory Visit: Payer: Self-pay | Admitting: Internal Medicine

## 2013-04-18 NOTE — Telephone Encounter (Signed)
Rx sent to pharmacy by escript for one month only. Called and left message that pt needed to call and schedule an OV for future refills.

## 2013-05-03 ENCOUNTER — Other Ambulatory Visit: Payer: Self-pay | Admitting: *Deleted

## 2013-05-03 ENCOUNTER — Ambulatory Visit (INDEPENDENT_AMBULATORY_CARE_PROVIDER_SITE_OTHER): Payer: Medicaid Other | Admitting: Cardiovascular Disease

## 2013-05-03 ENCOUNTER — Encounter: Payer: Self-pay | Admitting: Cardiovascular Disease

## 2013-05-03 VITALS — BP 148/82 | HR 80 | Ht 62.0 in | Wt 138.8 lb

## 2013-05-03 DIAGNOSIS — R000 Tachycardia, unspecified: Secondary | ICD-10-CM

## 2013-05-03 DIAGNOSIS — R0602 Shortness of breath: Secondary | ICD-10-CM

## 2013-05-03 DIAGNOSIS — I10 Essential (primary) hypertension: Secondary | ICD-10-CM

## 2013-05-03 MED ORDER — HYDROCHLOROTHIAZIDE 25 MG PO TABS: 25.0000 mg | ORAL_TABLET | Freq: Every day | ORAL | Status: DC

## 2013-05-03 MED ORDER — ATORVASTATIN CALCIUM 40 MG PO TABS: 20.0000 mg | ORAL_TABLET | Freq: Every day | ORAL | Status: DC

## 2013-05-03 MED ORDER — POTASSIUM CHLORIDE ER 10 MEQ PO TBCR: 10.0000 meq | EXTENDED_RELEASE_TABLET | Freq: Every day | ORAL | Status: DC

## 2013-05-03 MED ORDER — LOSARTAN POTASSIUM 100 MG PO TABS: 100.0000 mg | ORAL_TABLET | Freq: Every day | ORAL | Status: DC

## 2013-05-03 NOTE — Assessment & Plan Note (Signed)
Unice's  blood pressure continues to be mildly elevated. She also has had some episodes of dizziness and not feeling well. I suspect these may be due to hypertension. We'll add hydrochlorothiazide 25 mg a day. We'll also add potassium chloride 10 mg a day. I'll see her back in the office in 3 months. We'll check a basic metabolic profile at that time.

## 2013-05-03 NOTE — Patient Instructions (Addendum)
Added Hydrochlorothiazide 25 mg take 1 tablet daily.  Added Potassium Chloride 10 meq take 1 tablet daily.  Follow up with Dr. Elease Hashimoto in 3 months.  We will do BNP next visit (labs)

## 2013-05-03 NOTE — Progress Notes (Signed)
      [name] Date of Birth  1935/04/07 [facility]     [place] Office  [address]    [address]   [address] [place], [place]  [address]    [place], [place]  [address] [phone]  Fax  [phone]  [phone]  Fax [phone]  Problem List: 1. Hypertension  History of Present Illness:  Michele Rogers is a 78 yo female from [place] who is seen today with her granddaughter.  They are saddened today due to the death of her  Michele Rogers ( granddaughter's brother)  He was found dead in his bed yesterday.  She's done very well on the Losartan. Initially she was having some difficulty in getting Medicaid to improve the Losartan but she developed a cough with the lisinopril and they ultimately have approved the losartan.  Her blood pressures been doing well. She's been avoiding extra salt.  May 03, 2013:  Takeria has had some dizziness back in February.  The episodes have resolved.   She has not been exercising - is about to start.    Current Outpatient Prescriptions on File Prior to Visit  Medication Sig Dispense Refill  . aspirin 81 MG tablet Take 81 mg by mouth daily.      Marland Kitchen atorvastatin (LIPITOR) 40 MG tablet Take 0.5 tablets (20 mg total) by mouth daily.  45 tablet  0  . losartan (COZAAR) 100 MG tablet TAKE 1 TABLET BY MOUTH DAILY.  90 tablet  0  . metoprolol (LOPRESSOR) 100 MG tablet TAKE 1/2 A TABLET BY MOUTH 2 TIMES DAILY.  90 tablet  0   No current facility-administered medications on file prior to visit.    Allergies  Allergen Reactions  . Lisinopril Cough    Past Medical History  Diagnosis Date  . Hypertension   . Hyperlipidemia   . Headache   . Arthritis   . Chest pain     Past Surgical History  Procedure Laterality Date  . Abdominal hysterectomy      complete  . Cholecystectomy    . Vaginal delivery      5  . Arm surgery      History  Smoking status  . Never Smoker   Smokeless tobacco  . Never Used    History  Alcohol Use   . Yes    Comment: once a week    Family History  Problem Relation Age of Onset  . Cancer Sister     colon    Reviw of Systems:  Reviewed in the HPI.  All other systems are negative.  Physical Exam: BP 148/82  Pulse 80  Ht 5\' 2"  (1.575 m)  Wt 138 lb 12 oz (62.937 kg)  BMI 25.37 kg/m2 The patient is alert and oriented x 3.  The mood and affect are normal.   Skin: warm and dry.  Color is normal.    HEENT:   [place]/AT,  No JVD, normal carotids.   Lungs: clear to auscultation.  Heart: RR, no murmurs    Abdomen: non tender, +BS  Extremities:  No c/c/e.  She has a splint on her left wrist from a wrist fracture  Neuro:  CN intact, gait is normal    ECG: Debra 6, 2014: normal sinus rhythm at 63.Marland Kitchen Normal EKG. There are no changes from her previous tracing.   Laboratory work including a basic metabolic profile, hepatic profile, and lipid profile are pending. Assessment / Plan:

## 2013-05-03 NOTE — Telephone Encounter (Signed)
Refilled losartan and atorvastatin sent to [facility] pharmacy.

## 2013-05-30 ENCOUNTER — Ambulatory Visit (INDEPENDENT_AMBULATORY_CARE_PROVIDER_SITE_OTHER): Payer: Medicaid Other | Admitting: Internal Medicine

## 2013-05-30 ENCOUNTER — Encounter: Payer: Self-pay | Admitting: Internal Medicine

## 2013-05-30 VITALS — BP 130/72 | HR 70 | Temp 97.9°F | Ht 62.0 in | Wt 140.0 lb

## 2013-05-30 DIAGNOSIS — G250 Essential tremor: Secondary | ICD-10-CM | POA: Insufficient documentation

## 2013-05-30 DIAGNOSIS — M549 Dorsalgia, unspecified: Secondary | ICD-10-CM

## 2013-05-30 DIAGNOSIS — G8929 Other chronic pain: Secondary | ICD-10-CM

## 2013-05-30 DIAGNOSIS — E785 Hyperlipidemia, unspecified: Secondary | ICD-10-CM

## 2013-05-30 DIAGNOSIS — R51 Headache: Secondary | ICD-10-CM

## 2013-05-30 DIAGNOSIS — I10 Essential (primary) hypertension: Secondary | ICD-10-CM

## 2013-05-30 DIAGNOSIS — R519 Headache, unspecified: Secondary | ICD-10-CM | POA: Insufficient documentation

## 2013-05-30 DIAGNOSIS — J309 Allergic rhinitis, unspecified: Secondary | ICD-10-CM | POA: Insufficient documentation

## 2013-05-30 MED ORDER — METOPROLOL TARTRATE 100 MG PO TABS: ORAL_TABLET | ORAL | Status: DC

## 2013-05-30 MED ORDER — LORATADINE 10 MG PO TABS: 10.0000 mg | ORAL_TABLET | Freq: Every day | ORAL | Status: DC

## 2013-05-30 MED ORDER — TRAMADOL HCL 50 MG PO TABS: 50.0000 mg | ORAL_TABLET | Freq: Three times a day (TID) | ORAL | Status: DC | PRN

## 2013-05-30 NOTE — Assessment & Plan Note (Signed)
Symptoms of headache and dizziness likely related to ongoing allergic rhinitis and chronic middle ear effusion. Will start Claritin. Also discussed starting a nasal steroid if symptoms persistent. Follow up 4 weeks and prn.

## 2013-05-30 NOTE — Assessment & Plan Note (Addendum)
Intermittent headache likely related to allergic rhinitis. Exam is normal. Symptoms are mild and pt is not currently taking any medication for symptoms. Will see if any improvement with addition of Claritin.

## 2013-05-30 NOTE — Assessment & Plan Note (Signed)
Likely related to DDD. Will get plain film of thoracic spine for evaluation. Will start prn Tramadol for pain. Follow up 4 weeks and prn.

## 2013-05-30 NOTE — Assessment & Plan Note (Signed)
BP Readings from Last 3 Encounters:  05/30/13 130/72  05/03/13 148/82  02/03/13 180/84   BP well controlled on current medications. Will continue.

## 2013-05-30 NOTE — Progress Notes (Signed)
Subjective:    Patient ID: [name], female    DOB: [birth year], 78 y.o.   MRN: [id number]  HPI 78 year old female presents for followup. She has not been seen in one year. She has been followed by her cardiologist. She has several concerns today. First, she notes chronic diffuse aching headaches. These occur on almost a daily basis. They're associated with some clear nasal congestion and drainage. She has frequent sneezing and watery eyes. She is not currently taking anything for this.  She is also concerned about occasional episodes of dizziness described as vertigo. These last for a few seconds and then resolve without intervention.  She is also concerned about chronic history of mid upper back pain. She denies any trauma to her back. She did participate in heavy physical labor while living in [place]. The pain is described as aching it does not generally radiate. She does not take any medication for this.  In regards to her chronic health conditions including hypertension and hyperlipidemia, she reports compliance with her medications.  Outpatient Prescriptions Prior to Visit  Medication Sig Dispense Refill  . aspirin 81 MG tablet Take 81 mg by mouth daily.      Marland Kitchen atorvastatin (LIPITOR) 40 MG tablet Take 0.5 tablets (20 mg total) by mouth daily.  45 tablet  3  . hydrochlorothiazide (HYDRODIURIL) 25 MG tablet Take 1 tablet (25 mg total) by mouth daily.  90 tablet  3  . losartan (COZAAR) 100 MG tablet Take 1 tablet (100 mg total) by mouth daily.  90 tablet  3  . potassium chloride (K-DUR) 10 MEQ tablet Take 1 tablet (10 mEq total) by mouth daily.  90 tablet  3  . metoprolol (LOPRESSOR) 100 MG tablet TAKE 1/2 A TABLET BY MOUTH 2 TIMES DAILY.  90 tablet  0   No facility-administered medications prior to visit.    Review of Systems  Constitutional: Negative for fever, chills, appetite change, fatigue and unexpected weight change.  HENT: Negative for ear pain, congestion, sore throat,  trouble swallowing, neck pain, voice change and sinus pressure.   Eyes: Negative for visual disturbance.  Respiratory: Negative for cough, shortness of breath, wheezing and stridor.   Cardiovascular: Negative for chest pain, palpitations and leg swelling.  Gastrointestinal: Negative for nausea, vomiting, abdominal pain, diarrhea, constipation, blood in stool, abdominal distention and anal bleeding.  Genitourinary: Negative for dysuria and flank pain.  Musculoskeletal: Positive for myalgias, back pain and arthralgias. Negative for gait problem.  Skin: Negative for color change and rash.  Neurological: Positive for dizziness and headaches.  Hematological: Negative for adenopathy. Does not bruise/bleed easily.  Psychiatric/Behavioral: Negative for suicidal ideas, sleep disturbance and dysphoric mood. The patient is not nervous/anxious.        Objective:   Physical Exam  Constitutional: She is oriented to person, place, and time. She appears well-developed and well-nourished. No distress.  HENT:  Head: Normocephalic and atraumatic.  Right Ear: External ear normal. A middle ear effusion is present.  Left Ear: External ear normal. A middle ear effusion is present.  Nose: Nose normal.  Mouth/Throat: Oropharynx is clear and moist. No oropharyngeal exudate.  Eyes: Conjunctivae are normal. Pupils are equal, round, and reactive to light. Right eye exhibits no discharge. Left eye exhibits no discharge. No scleral icterus.  Neck: Normal range of motion. Neck supple. No tracheal deviation present. No thyromegaly present.  Cardiovascular: Normal rate, regular rhythm, normal heart sounds and intact distal pulses.  Exam reveals no gallop and no  friction rub.   No murmur heard. Pulmonary/Chest: Effort normal and breath sounds normal. No accessory muscle usage. Not tachypneic. No respiratory distress. She has no decreased breath sounds. She has no wheezes. She has no rhonchi. She has no rales. She exhibits  no tenderness.  Musculoskeletal: Normal range of motion. She exhibits no edema and no tenderness.  Lymphadenopathy:    She has no cervical adenopathy.  Neurological: She is alert and oriented to person, place, and time. No cranial nerve deficit. She exhibits normal muscle tone. Coordination normal.  Skin: Skin is warm and dry. No rash noted. She is not diaphoretic. No erythema. No pallor.  Psychiatric: She has a normal mood and affect. Her behavior is normal. Judgment and thought content normal.          Assessment & Plan:

## 2013-05-30 NOTE — Assessment & Plan Note (Signed)
Intermittent head tremor noted. Consistent with benign tremor. Will monitor for now.

## 2013-05-30 NOTE — Assessment & Plan Note (Signed)
Will check lipids and LFTs with labs today. Continue Atorvastatin. 

## 2013-05-31 LAB — LIPID PANEL
Cholesterol: 156 mg/dL (ref 0–200)
HDL: 50 mg/dL (ref >39.00)
Total CHOL/HDL Ratio: 3
VLDL: 47.6 mg/dL — ABNORMAL HIGH (ref 0.0–40.0)

## 2013-05-31 LAB — COMPREHENSIVE METABOLIC PANEL
ALT: 18 U/L (ref 0–35)
Albumin: 3.8 g/dL (ref 3.5–5.2)
Alkaline Phosphatase: 89 U/L (ref 39–117)
CO2: 31 mEq/L (ref 19–32)
GFR: 74.21 mL/min (ref >60.00)
Glucose, Bld: 120 mg/dL — ABNORMAL HIGH (ref 70–99)
Potassium: 4.5 mEq/L (ref 3.5–5.1)
Sodium: 140 mEq/L (ref 135–145)
Total Bilirubin: 0.8 mg/dL (ref 0.3–1.2)
Total Protein: 7.2 g/dL (ref 6.0–8.3)

## 2013-05-31 LAB — CBC WITH DIFFERENTIAL/PLATELET
Basophils Absolute: 0.1 10*3/uL (ref 0.0–0.1)
Eosinophils Relative: 1.4 % (ref 0.0–5.0)
HCT: 37.3 % (ref 36.0–46.0)
Lymphs Abs: 3.1 10*3/uL (ref 0.7–4.0)
MCV: 96.7 fl (ref 78.0–100.0)
Monocytes Absolute: 0.5 10*3/uL (ref 0.1–1.0)
Neutrophils Relative %: 45.5 % (ref 43.0–77.0)
Platelets: 228 10*3/uL (ref 150.0–400.0)
RDW: 12.9 % (ref 11.5–14.6)
WBC: 6.9 10*3/uL (ref 4.5–10.5)

## 2013-05-31 LAB — MICROALBUMIN / CREATININE URINE RATIO
Creatinine,U: 35.4 mg/dL
Microalb Creat Ratio: 0.3 mg/g (ref 0.0–30.0)

## 2013-08-10 ENCOUNTER — Other Ambulatory Visit: Payer: Self-pay

## 2013-08-10 ENCOUNTER — Encounter: Payer: Self-pay | Admitting: Cardiovascular Disease

## 2013-08-10 ENCOUNTER — Ambulatory Visit (INDEPENDENT_AMBULATORY_CARE_PROVIDER_SITE_OTHER): Payer: Medicaid Other | Admitting: Cardiovascular Disease

## 2013-08-10 VITALS — BP 152/88 | HR 75 | Ht 62.0 in | Wt 142.5 lb

## 2013-08-10 DIAGNOSIS — E785 Hyperlipidemia, unspecified: Secondary | ICD-10-CM

## 2013-08-10 DIAGNOSIS — I10 Essential (primary) hypertension: Secondary | ICD-10-CM

## 2013-08-10 MED ORDER — METOPROLOL TARTRATE 100 MG PO TABS: ORAL_TABLET | ORAL | Status: DC

## 2013-08-10 MED ORDER — LOSARTAN POTASSIUM 100 MG PO TABS: 100.0000 mg | ORAL_TABLET | Freq: Every day | ORAL | Status: DC

## 2013-08-10 MED ORDER — POTASSIUM CHLORIDE CRYS ER 10 MEQ PO TBCR: 10.0000 meq | EXTENDED_RELEASE_TABLET | Freq: Every day | ORAL | Status: DC

## 2013-08-10 MED ORDER — ATORVASTATIN CALCIUM 40 MG PO TABS: 20.0000 mg | ORAL_TABLET | Freq: Every day | ORAL | Status: DC

## 2013-08-10 MED ORDER — HYDROCHLOROTHIAZIDE 25 MG PO TABS: 25.0000 mg | ORAL_TABLET | Freq: Every day | ORAL | Status: DC

## 2013-08-10 NOTE — Progress Notes (Signed)
Michele Rogers Date of Birth  [birth year] [facility]     [place] Office  [address]    [address]   [address] [place], [place]  [address]    [place], [place]  [address] [phone]  Fax  [phone]  [phone]  Fax [phone]  Problem List: 1. Hypertension  History of Present Illness:  Michele Rogers is a 78 yo female from [place] who is seen today with her granddaughter.  They are saddened today due to the death of her  Michele Rogers ( granddaughter's brother)  He was found dead in his bed yesterday.  She's done very well on the Losartan. Initially she was having some difficulty in getting Medicaid to improve the Losartan but she developed a cough with the lisinopril and they ultimately have approved the losartan.  Her blood pressures been doing well. She's been avoiding extra salt.  May 03, 2013:  Michele Rogers has had some dizziness back in February.  The episodes have resolved.   She has not been exercising - is about to start.    August 10, 2012:  Michele Rogers is doing well.  We prescribed Hydrochlorothiazide and potassium chloride during her last visit. She took both of these for one month and then never had the prescriptions refilled. She complains of some mild leg swelling.  She's scheduled to leave for [place] in September and will stay through January. She's not having any cardiac complaints.  Current Outpatient Prescriptions on File Prior to Visit  Medication Sig Dispense Refill  . aspirin 81 MG tablet Take 81 mg by mouth daily.      Marland Kitchen atorvastatin (LIPITOR) 40 MG tablet Take 0.5 tablets (20 mg total) by mouth daily.  45 tablet  3  . loratadine (CLARITIN) 10 MG tablet Take 1 tablet (10 mg total) by mouth daily.  30 tablet  11  . losartan (COZAAR) 100 MG tablet Take 1 tablet (100 mg total) by mouth daily.  90 tablet  3  . metoprolol (LOPRESSOR) 100 MG tablet TAKE 1/2 A TABLET BY MOUTH 2 TIMES DAILY.  90 tablet  4  . traMADol (ULTRAM) 50 MG tablet Take 1 tablet (50  mg total) by mouth every 8 (eight) hours as needed for pain.  30 tablet  0   No current facility-administered medications on file prior to visit.    Allergies  Allergen Reactions  . Lisinopril Cough    Past Medical History  Diagnosis Date  . Hypertension   . Hyperlipidemia   . Headache(784.0)   . Arthritis   . Chest pain     Past Surgical History  Procedure Laterality Date  . Abdominal hysterectomy      complete  . Cholecystectomy    . Vaginal delivery      5  . Arm surgery    . Humerus fracture surgery      left arm    History  Smoking status  . Never Smoker   Smokeless tobacco  . Never Used    History  Alcohol Use  . Yes    Comment: once a week    Family History  Problem Relation Age of Onset  . Cancer Sister     colon    Reviw of Systems:  Reviewed in the HPI.  All other systems are negative.  Physical Exam: BP 152/88  Pulse 75  Ht 5\' 2"  (1.575 m)  Wt 142 lb 8 oz (64.638 kg)  BMI 26.06 kg/m2 The patient is  alert and oriented x 3.  The mood and affect are normal.   Skin: warm and dry.  Color is normal.    HEENT:   [place]/AT,  No JVD, normal carotids.   Lungs: clear to auscultation.  Heart: RR, no murmurs    Abdomen: non tender, +BS  Extremities:  She has trace leg edema.     Neuro:  CN intact, gait is normal    ECG: August 10, 2013:  NSR at 75.  No ST or T wave changes.    Assessment / Plan:

## 2013-08-10 NOTE — Assessment & Plan Note (Addendum)
Regis  seems to be doing well. Her blood pressure is mildly elevated and she does have trace leg edema. We will repeat new her prescription for HCTZ 25 mg a day and potassium chloride 10 mEq a day. We will see her back in 2-3 weeks for a basic metabolic profile to make sure that her potassium and renal function remains stable. I'll see her back in approximately 6 months for followup office visit and basic metabolic profile.

## 2013-08-10 NOTE — Patient Instructions (Addendum)
Your physician recommends that you return for lab work in: 2 WEEKS.  Your physician wants you to follow-up in: 6 MONTHS.   You will receive a reminder letter in the mail two months in advance. If you don't receive a letter, please call our office to schedule the follow-up appointment.  Your physician has recommended you make the following change in your medication: START HCTZ 25 mg EACH MORNING AND START POTASSIUM CHLORIDE 10 meq TO BE TAKEN WITH HCTZ.

## 2013-08-24 ENCOUNTER — Ambulatory Visit (INDEPENDENT_AMBULATORY_CARE_PROVIDER_SITE_OTHER): Payer: Medicaid Other

## 2013-08-24 DIAGNOSIS — I10 Essential (primary) hypertension: Secondary | ICD-10-CM

## 2013-08-24 DIAGNOSIS — E785 Hyperlipidemia, unspecified: Secondary | ICD-10-CM

## 2013-08-25 LAB — BASIC METABOLIC PANEL
GFR calc Af Amer: 68 mL/min/{1.73_m2} (ref >59)
GFR calc non Af Amer: 59 mL/min/{1.73_m2} — ABNORMAL LOW (ref >59)
Glucose: 143 mg/dL — ABNORMAL HIGH (ref 65–99)
Potassium: 4.8 mmol/L (ref 3.5–5.2)

## 2013-10-10 ENCOUNTER — Other Ambulatory Visit: Payer: Self-pay

## 2013-10-10 DIAGNOSIS — I10 Essential (primary) hypertension: Secondary | ICD-10-CM

## 2013-10-10 DIAGNOSIS — E785 Hyperlipidemia, unspecified: Secondary | ICD-10-CM

## 2013-10-10 MED ORDER — ATORVASTATIN CALCIUM 40 MG PO TABS: 20.0000 mg | ORAL_TABLET | Freq: Every day | ORAL | Status: DC

## 2013-10-10 MED ORDER — POTASSIUM CHLORIDE CRYS ER 10 MEQ PO TBCR: 10.0000 meq | EXTENDED_RELEASE_TABLET | Freq: Every day | ORAL | Status: DC

## 2013-10-10 MED ORDER — LOSARTAN POTASSIUM 100 MG PO TABS: 100.0000 mg | ORAL_TABLET | Freq: Every day | ORAL | Status: DC

## 2013-10-10 MED ORDER — HYDROCHLOROTHIAZIDE 25 MG PO TABS
25.0000 mg | ORAL_TABLET | Freq: Every day | ORAL | Status: DC
Start: 2013-10-10 — End: 2013-10-14

## 2013-10-10 NOTE — Telephone Encounter (Signed)
Pt would like refills she will be out of the country next month.

## 2013-10-14 ENCOUNTER — Other Ambulatory Visit: Payer: Self-pay | Admitting: *Deleted

## 2013-10-14 DIAGNOSIS — I10 Essential (primary) hypertension: Secondary | ICD-10-CM

## 2013-10-14 DIAGNOSIS — E785 Hyperlipidemia, unspecified: Secondary | ICD-10-CM

## 2013-10-14 MED ORDER — POTASSIUM CHLORIDE CRYS ER 10 MEQ PO TBCR: 10.0000 meq | EXTENDED_RELEASE_TABLET | Freq: Every day | ORAL | Status: DC

## 2013-10-14 MED ORDER — LOSARTAN POTASSIUM 100 MG PO TABS: 100.0000 mg | ORAL_TABLET | Freq: Every day | ORAL | Status: DC

## 2013-10-14 MED ORDER — ATORVASTATIN CALCIUM 40 MG PO TABS: 20.0000 mg | ORAL_TABLET | Freq: Every day | ORAL | Status: DC

## 2013-10-14 MED ORDER — HYDROCHLOROTHIAZIDE 25 MG PO TABS: 25.0000 mg | ORAL_TABLET | Freq: Every day | ORAL | Status: DC

## 2013-10-14 NOTE — Telephone Encounter (Signed)
Requested Prescriptions   Signed Prescriptions Disp Refills  . atorvastatin (LIPITOR) 40 MG tablet 45 tablet 3    Sig: Take 0.5 tablets (20 mg total) by mouth daily.    Authorizing Provider: Vesta Mixer    Ordering User: Shawnie Dapper, MARINA C  . potassium chloride (K-DUR,KLOR-CON) 10 MEQ tablet 90 tablet 3    Sig: Take 1 tablet (10 mEq total) by mouth daily.    Authorizing Provider: Vesta Mixer    Ordering User: Shawnie Dapper, MARINA C  . losartan (COZAAR) 100 MG tablet 90 tablet 3    Sig: Take 1 tablet (100 mg total) by mouth daily.    Authorizing Provider: Vesta Mixer    Ordering User: Shawnie Dapper, MARINA C  . hydrochlorothiazide (HYDRODIURIL) 25 MG tablet 90 tablet 3    Sig: Take 1 tablet (25 mg total) by mouth daily.    Authorizing Provider: Vesta Mixer    Ordering User: Kendrick Fries

## 2013-10-14 NOTE — Telephone Encounter (Signed)
Patient is going out of town wont be back in town until the end of January. Please give a 90 day supply

## 2014-02-20 ENCOUNTER — Ambulatory Visit (INDEPENDENT_AMBULATORY_CARE_PROVIDER_SITE_OTHER): Payer: Medicaid Other | Admitting: Cardiovascular Disease

## 2014-02-20 ENCOUNTER — Encounter: Payer: Self-pay | Admitting: Cardiovascular Disease

## 2014-02-20 VITALS — BP 131/79 | HR 80 | Ht 62.0 in | Wt 143.8 lb

## 2014-02-20 DIAGNOSIS — R0602 Shortness of breath: Secondary | ICD-10-CM

## 2014-02-20 DIAGNOSIS — I10 Essential (primary) hypertension: Secondary | ICD-10-CM

## 2014-02-20 NOTE — Progress Notes (Signed)
Michele ParkMaria O Rogers Date of Birth  07/08/1935 [facility]     [place] Office  [address]    [address]   [address] [place], [place]  [id number]    [place], [place]  [id number] [phone]  Fax  [phone]  [phone]  Fax [phone]  Problem List: 1. Hypertension  History of Present Illness:  Michele MeierOliva is a 79 yo female from [place] who is seen today with her granddaughter.  They are saddened today due to the death of her  Michele MaineGrandson ( granddaughter's brother)  He was found dead in his bed yesterday.  She's done very well on the Losartan. Initially she was having some difficulty in getting Medicaid to improve the Losartan but she developed a cough with the lisinopril and they ultimately have approved the losartan.  Her blood pressures been doing well. She's been avoiding extra salt.  May 03, 2013:  Michele Rogers has had some dizziness back in February.  The episodes have resolved.   She has not been exercising - is about to start.    August 10, 2012:  Michele Rogers is doing well.  We prescribed Hydrochlorothiazide and potassium chloride during her last visit. She took both of these for one month and then never had the prescriptions refilled. She complains of some mild leg swelling.  She's scheduled to leave for [place] in September and will stay through January. She's not having any cardiac complaints.  Feb. 23, 2015:  Michele Rogers is doing OK.  Occasional ankle edema and mild headaches.    Current Outpatient Prescriptions on File Prior to Visit  Medication Sig Dispense Refill  . aspirin 81 MG tablet Take 81 mg by mouth daily.      Marland Kitchen. atorvastatin (LIPITOR) 40 MG tablet Take 0.5 tablets (20 mg total) by mouth daily.  45 tablet  3  . hydrochlorothiazide (HYDRODIURIL) 25 MG tablet Take 1 tablet (25 mg total) by mouth daily.  90 tablet  3  . loratadine (CLARITIN) 10 MG tablet Take 1 tablet (10 mg total) by mouth daily.  30 tablet  11  . losartan (COZAAR) 100 MG tablet Take 1 tablet  (100 mg total) by mouth daily.  90 tablet  3  . metoprolol (LOPRESSOR) 100 MG tablet TAKE 1/2 A TABLET BY MOUTH 2 TIMES DAILY.  90 tablet  4  . potassium chloride (K-DUR,KLOR-CON) 10 MEQ tablet Take 1 tablet (10 mEq total) by mouth daily.  90 tablet  3  . traMADol (ULTRAM) 50 MG tablet Take 1 tablet (50 mg total) by mouth every 8 (eight) hours as needed for pain.  30 tablet  0   No current facility-administered medications on file prior to visit.    Allergies  Allergen Reactions  . Lisinopril Cough    Past Medical History  Diagnosis Date  . Hypertension   . Hyperlipidemia   . Headache(784.0)   . Arthritis   . Chest pain     Past Surgical History  Procedure Laterality Date  . Abdominal hysterectomy      complete  . Cholecystectomy    . Vaginal delivery      5  . Arm surgery    . Humerus fracture surgery      left arm    History  Smoking status  . Never Smoker   Smokeless tobacco  . Never Used    History  Alcohol Use No    Comment: once a week    Family History  Problem  Relation Age of Onset  . Cancer Sister     colon    Reviw of Systems:  Reviewed in the HPI.  All other systems are negative.  Physical Exam: BP 131/79  Pulse 80  Ht 5\' 2"  (1.575 m)  Wt 143 lb 12 oz (65.205 kg)  BMI 26.29 kg/m2 The patient is alert and oriented x 3.  The mood and affect are normal.   Skin: warm and dry.  Color is normal.    HEENT:   [place]/AT,  No JVD, normal carotids.   Lungs: clear to auscultation.  Heart: RR, no murmurs    Abdomen: non tender, +BS  Extremities:  She has trace leg edema.     Neuro:  CN intact, gait is normal    ECG: Feb. 23, 2015:    NSR at 80 .  normal ECG.    Assessment / Plan:

## 2014-02-20 NOTE — Patient Instructions (Addendum)
Go to [facility]may also be called [facility]) to have your gait evaluated.   This will help you pick out the correct shoes.   Your physician recommends that you return for lab work in:  Today: Basic metabolic panel 6 months: Basic metabolic panel   Your physician wants you to follow-up in: 6 months. You will receive a reminder letter in the mail two months in advance. If you don't receive a letter, please call our office to schedule the follow-up appointment.

## 2014-02-20 NOTE — Assessment & Plan Note (Addendum)
Her BP remains very well controlled.    Continue with current meds.  We will check BMP today.    I have encouraged her to walk more.  I have given her info about getting the correct shoes.

## 2014-02-21 LAB — BASIC METABOLIC PANEL
BUN/Creatinine Ratio: 16 (ref 11–26)
BUN: 14 mg/dL (ref 8–27)
CHLORIDE: 101 mmol/L (ref 97–108)
CO2: 28 mmol/L (ref 18–29)
Calcium: 9.3 mg/dL (ref 8.7–10.3)
Creatinine, Ser: 0.89 mg/dL (ref 0.57–1.00)
GFR calc Af Amer: 73 mL/min/{1.73_m2} (ref >59)
GFR calc non Af Amer: 63 mL/min/{1.73_m2} (ref >59)
GLUCOSE: 121 mg/dL — AB (ref 65–99)
Potassium: 5.4 mmol/L — ABNORMAL HIGH (ref 3.5–5.2)
Sodium: 143 mmol/L (ref 134–144)

## 2014-03-17 ENCOUNTER — Encounter: Payer: Self-pay | Admitting: Adult Health

## 2014-03-17 ENCOUNTER — Ambulatory Visit
Admission: RE | Admit: 2014-03-17 | Discharge: 2014-03-17 | Disposition: A | Discharge status: Home / Self Care | Payer: Medicaid Other | Source: Ambulatory Visit | Attending: Adult Health | Admitting: Adult Health

## 2014-03-17 ENCOUNTER — Other Ambulatory Visit: Payer: Self-pay | Admitting: Adult Health

## 2014-03-17 ENCOUNTER — Ambulatory Visit (INDEPENDENT_AMBULATORY_CARE_PROVIDER_SITE_OTHER)
Admission: RE | Admit: 2014-03-17 | Discharge: 2014-03-17 | Disposition: A | Discharge status: Home / Self Care | Payer: Medicaid Other | Source: Ambulatory Visit | Attending: Adult Health | Admitting: Adult Health

## 2014-03-17 ENCOUNTER — Ambulatory Visit (INDEPENDENT_AMBULATORY_CARE_PROVIDER_SITE_OTHER): Payer: Medicaid Other | Admitting: Adult Health

## 2014-03-17 VITALS — BP 108/58 | HR 98 | Temp 97.8°F | Wt 142.0 lb

## 2014-03-17 DIAGNOSIS — M549 Dorsalgia, unspecified: Secondary | ICD-10-CM

## 2014-03-17 DIAGNOSIS — R079 Chest pain, unspecified: Secondary | ICD-10-CM

## 2014-03-17 DIAGNOSIS — N63 Unspecified lump in unspecified breast: Secondary | ICD-10-CM

## 2014-03-17 DIAGNOSIS — R0781 Pleurodynia: Secondary | ICD-10-CM

## 2014-03-17 DIAGNOSIS — R928 Other abnormal and inconclusive findings on diagnostic imaging of breast: Secondary | ICD-10-CM

## 2014-03-17 NOTE — Progress Notes (Signed)
Patient ID: Michele Rogers, female   DOB: 07/19/1935, 79 y.o.   MRN: [id number]    Subjective:    Patient ID: Michele ParkMaria O Rogers, female    DOB: 08/19/1935, 79 y.o.   MRN: [id number]  HPI  Pt is a pleasant 79 y/o Hispanic female who presents to clinic with thoracic back pain, left sided rib pain and pain in her left breast. She reports falling a couple of years ago off of a ladder. She broke her arm and has had back pain since. She was sent for xrays of her thoracic spine but she never went. She is experiencing pain on the left side of her ribs close to her axilla. Pt has not had any mammograms in years because she has not wanted to. She has a tender area on her left outer breast.   Past Medical History  Diagnosis Date  . Hypertension   . Hyperlipidemia   . Headache(784.0)   . Arthritis   . Chest pain     Current Outpatient Prescriptions on File Prior to Visit  Medication Sig Dispense Refill  . aspirin 81 MG tablet Take 81 mg by mouth daily.      Marland Kitchen. atorvastatin (LIPITOR) 40 MG tablet Take 0.5 tablets (20 mg total) by mouth daily.  45 tablet  3  . hydrochlorothiazide (HYDRODIURIL) 25 MG tablet Take 1 tablet (25 mg total) by mouth daily.  90 tablet  3  . loratadine (CLARITIN) 10 MG tablet Take 1 tablet (10 mg total) by mouth daily.  30 tablet  11  . losartan (COZAAR) 100 MG tablet Take 1 tablet (100 mg total) by mouth daily.  90 tablet  3  . metoprolol (LOPRESSOR) 100 MG tablet TAKE 1/2 A TABLET BY MOUTH 2 TIMES DAILY.  90 tablet  4  . potassium chloride (K-DUR,KLOR-CON) 10 MEQ tablet Take 1 tablet (10 mEq total) by mouth daily.  90 tablet  3  . traMADol (ULTRAM) 50 MG tablet Take 1 tablet (50 mg total) by mouth every 8 (eight) hours as needed for pain.  30 tablet  0   No current facility-administered medications on file prior to visit.     Review of Systems  Respiratory: Negative.   Cardiovascular: Negative.   Musculoskeletal: Positive for back pain.       Left rib pain         Objective:  BP 108/58  Pulse 98  Temp(Src) 97.8 F (36.6 C) (Oral)  Wt 142 lb (64.411 kg)  SpO2 97%   Physical Exam  Constitutional: She is oriented to person, place, and time. She appears well-developed and well-nourished.  Appears uncomfortable with certain movements  Cardiovascular: Normal rate, regular rhythm and normal heart sounds.  Exam reveals no gallop.   No murmur heard. Pulmonary/Chest: Effort normal and breath sounds normal. No respiratory distress. She has no wheezes. She has no rales.    Musculoskeletal: Normal range of motion. She exhibits tenderness. She exhibits no edema.  Point tenderness with palpation of thoracic spine area. She also has tenderness with palpation of ribs on the left side laterally.  Neurological: She is alert and oriented to person, place, and time.  Psychiatric: She has a normal mood and affect. Her behavior is normal. Judgment and thought content normal.       Assessment & Plan:   1. Back pain Larey SeatFell a couple of years ago. Dr. Dan HumphreysWalker had ordered thoracic films but pt never had them done. [facility]  - DG Thoracic Spine 4V; Future  2. Breast nodule Palpable nodule at 3 o'clock. Send for mammogram  - MM Digital Diagnostic Bilat; Future  3. Rib pain on left side Uncertain if pain also related to old injury from fall. Will send for xray - DG Ribs Unilateral Left; Future

## 2014-03-17 NOTE — Progress Notes (Signed)
Pre visit review using our clinic review tool, if applicable. No additional management support is needed unless otherwise documented below in the visit note. 

## 2014-03-29 ENCOUNTER — Other Ambulatory Visit: Payer: Self-pay | Admitting: Adult Health

## 2014-03-29 ENCOUNTER — Telehealth: Payer: Self-pay | Admitting: *Deleted

## 2014-03-29 DIAGNOSIS — N6009 Solitary cyst of unspecified breast: Secondary | ICD-10-CM

## 2014-03-29 NOTE — Telephone Encounter (Signed)
[facility] called requesting an order be placed for an US of left breast

## 2014-03-31 ENCOUNTER — Ambulatory Visit: Payer: Self-pay | Admitting: Adult Health

## 2014-05-23 ENCOUNTER — Encounter: Payer: Self-pay | Admitting: Adult Health

## 2014-05-24 ENCOUNTER — Telehealth: Payer: Self-pay | Admitting: *Deleted

## 2014-05-24 NOTE — Telephone Encounter (Signed)
PA to [facility] for losartan, must have failed a ACE INHIBITOR.

## 2014-08-23 ENCOUNTER — Ambulatory Visit (INDEPENDENT_AMBULATORY_CARE_PROVIDER_SITE_OTHER): Payer: Medicaid Other | Admitting: Cardiovascular Disease

## 2014-08-23 ENCOUNTER — Encounter: Payer: Self-pay | Admitting: Cardiovascular Disease

## 2014-08-23 VITALS — BP 113/68 | HR 67 | Ht 62.0 in | Wt 144.5 lb

## 2014-08-23 DIAGNOSIS — R0602 Shortness of breath: Secondary | ICD-10-CM

## 2014-08-23 DIAGNOSIS — E785 Hyperlipidemia, unspecified: Secondary | ICD-10-CM

## 2014-08-23 DIAGNOSIS — I10 Essential (primary) hypertension: Secondary | ICD-10-CM

## 2014-08-23 MED ORDER — METOPROLOL TARTRATE 100 MG PO TABS: ORAL_TABLET | ORAL | Status: DC

## 2014-08-23 NOTE — Patient Instructions (Signed)
Your physician wants you to follow-up in: 6 months  You will receive a reminder letter in the mail two months in advance. If you don't receive a letter, please call our office to schedule the follow-up appointment.  Your physician recommends that you continue on your current medications as directed. Please refer to the Current Medication list given to you today.  

## 2014-08-23 NOTE — Progress Notes (Signed)
[name] Date of Birth  1935/01/03 [facility]     [place] Office  [address]    [address]   [address] [place], [place]  [address]    [place], [place]  [address] [phone]  Fax  [phone]  [phone]  Fax [phone]  Problem List: 1. Hypertension  History of Present Illness:  Michele Rogers is a 79 yo female from [place] who is seen today with her granddaughter.  They are saddened today due to the death of her  [name] ( granddaughter's brother)  He was found dead in his bed yesterday.  She's done very well on the Losartan. Initially she was having some difficulty in getting Medicaid to improve the Losartan but she developed a cough with the lisinopril and they ultimately have approved the losartan.  Her blood pressures been doing well. She's been avoiding extra salt.  May 03, 2013:  Michele Rogers has had some dizziness back in February.  The episodes have resolved.   She has not been exercising - is about to start.    August 10, 2012:  Michele Rogers is doing well.  We prescribed Hydrochlorothiazide and potassium chloride during her last visit. She took both of these for one month and then never had the prescriptions refilled. She complains of some mild leg swelling.  She's scheduled to leave for [place] in September and will stay through January. She's not having any cardiac complaints.  Feb. 23, 2015:  Michele Rogers is doing OK.  Occasional ankle edema and mild headaches.    August 23, 2014:    Current Outpatient Prescriptions on File Prior to Visit  Medication Sig Dispense Refill  . aspirin 81 MG tablet Take 81 mg by mouth daily.      Marland Kitchen atorvastatin (LIPITOR) 40 MG tablet Take 0.5 tablets (20 mg total) by mouth daily.  45 tablet  3  . hydrochlorothiazide (HYDRODIURIL) 25 MG tablet Take 1 tablet (25 mg total) by mouth daily.  90 tablet  3  . loratadine (CLARITIN) 10 MG tablet Take 1 tablet (10 mg total) by mouth daily.  30 tablet  11  . losartan (COZAAR) 100  MG tablet Take 1 tablet (100 mg total) by mouth daily.  90 tablet  3  . metoprolol (LOPRESSOR) 100 MG tablet TAKE 1/2 A TABLET BY MOUTH 2 TIMES DAILY.  90 tablet  4  . potassium chloride (K-DUR,KLOR-CON) 10 MEQ tablet Take 1 tablet (10 mEq total) by mouth daily.  90 tablet  3  . traMADol (ULTRAM) 50 MG tablet Take 1 tablet (50 mg total) by mouth every 8 (eight) hours as needed for pain.  30 tablet  0   No current facility-administered medications on file prior to visit.    Allergies  Allergen Reactions  . Lisinopril Cough    Past Medical History  Diagnosis Date  . Hypertension   . Hyperlipidemia   . Headache(784.0)   . Arthritis   . Chest pain     Past Surgical History  Procedure Laterality Date  . Abdominal hysterectomy      complete  . Cholecystectomy    . Vaginal delivery      5  . Arm surgery    . Humerus fracture surgery      left arm    History  Smoking status  . Never Smoker   Smokeless tobacco  . Never Used    History  Alcohol Use No    Comment: once a week  Family History  Problem Relation Age of Onset  . Cancer Sister     colon    Reviw of Systems:  Reviewed in the HPI.  All other systems are negative.  Physical Exam: BP 113/68  Pulse 67  Ht  (1.575 m)  Wt 144 lb 8 oz (65.545 kg)  BMI 26.42 kg/m2 The patient is alert and oriented x 3.  The mood and affect are normal.   Skin: warm and dry.  Color is normal.    HEENT:   [place]/AT,  No JVD, normal carotids.   Lungs: clear to auscultation.  Heart: RR, no murmurs    Abdomen: non tender, +BS  Extremities:  She has trace leg edema.     Neuro:  CN intact, gait is normal    ECG: August 23, 2014:  NSR at 67,  No ST or T wave changes.     Assessment / Plan:

## 2014-08-23 NOTE — Assessment & Plan Note (Signed)
Reet is doing well.  She was seen with her granddaughter who served as Marketing executive.   No real issues.   Continue current meds.  Will see her in 6 months.

## 2014-10-02 ENCOUNTER — Other Ambulatory Visit: Payer: Self-pay | Admitting: *Deleted

## 2014-10-02 DIAGNOSIS — I10 Essential (primary) hypertension: Secondary | ICD-10-CM

## 2014-10-02 DIAGNOSIS — E785 Hyperlipidemia, unspecified: Secondary | ICD-10-CM

## 2014-10-02 MED ORDER — LOSARTAN POTASSIUM 100 MG PO TABS: 100.0000 mg | ORAL_TABLET | Freq: Every day | ORAL | Status: DC

## 2014-10-02 NOTE — Telephone Encounter (Signed)
90 day supply

## 2014-10-03 ENCOUNTER — Other Ambulatory Visit: Payer: Self-pay | Admitting: *Deleted

## 2014-10-03 ENCOUNTER — Telehealth: Payer: Self-pay | Admitting: Cardiovascular Disease

## 2014-10-03 DIAGNOSIS — I10 Essential (primary) hypertension: Secondary | ICD-10-CM

## 2014-10-03 DIAGNOSIS — E785 Hyperlipidemia, unspecified: Secondary | ICD-10-CM

## 2014-10-03 MED ORDER — LOSARTAN POTASSIUM 100 MG PO TABS: 100.0000 mg | ORAL_TABLET | Freq: Every day | ORAL | Status: DC

## 2014-10-03 NOTE — Telephone Encounter (Signed)
Notified patient's daughter the Losartan 100 mg has been called in for 90 day supply.

## 2014-10-03 NOTE — Telephone Encounter (Signed)
Got medication, for Losartan but only got 30 day need it for 90 day. Needs to know what to do? Pt is leaving to [place] Thursday. Please call and advise.

## 2014-10-03 NOTE — Telephone Encounter (Signed)
Pt Granddaughter called trying to get refill sent in for Losartan [facility] [address]

## 2014-12-14 ENCOUNTER — Other Ambulatory Visit: Payer: Self-pay | Admitting: Cardiovascular Disease

## 2014-12-14 DIAGNOSIS — I10 Essential (primary) hypertension: Secondary | ICD-10-CM

## 2014-12-14 DIAGNOSIS — E785 Hyperlipidemia, unspecified: Secondary | ICD-10-CM

## 2014-12-14 MED ORDER — POTASSIUM CHLORIDE CRYS ER 10 MEQ PO TBCR: 10.0000 meq | EXTENDED_RELEASE_TABLET | Freq: Every day | ORAL | Status: DC

## 2014-12-14 NOTE — Telephone Encounter (Signed)
Refill sent for potassium ?

## 2014-12-14 NOTE — Telephone Encounter (Signed)
Pt grand daughter called for us to send in a refill to [facility] [address]  The pt is currently out of the country and her uncle is coming there to pt tom[place] morning, they would like us to send it in so he can bring it to her. Please call her uncle if there's any complications. They would like to get it tonight before he leaves.

## 2015-01-16 ENCOUNTER — Other Ambulatory Visit: Payer: Self-pay | Admitting: Cardiovascular Disease

## 2015-02-10 ENCOUNTER — Other Ambulatory Visit: Payer: Self-pay | Admitting: Cardiovascular Disease

## 2015-03-05 ENCOUNTER — Ambulatory Visit (INDEPENDENT_AMBULATORY_CARE_PROVIDER_SITE_OTHER): Payer: Medicaid Other | Admitting: Cardiovascular Disease

## 2015-03-05 ENCOUNTER — Encounter: Payer: Self-pay | Admitting: Cardiovascular Disease

## 2015-03-05 VITALS — BP 138/70 | HR 80 | Ht 62.0 in | Wt 137.1 lb

## 2015-03-05 DIAGNOSIS — E785 Hyperlipidemia, unspecified: Secondary | ICD-10-CM

## 2015-03-05 DIAGNOSIS — I10 Essential (primary) hypertension: Secondary | ICD-10-CM

## 2015-03-05 NOTE — Progress Notes (Signed)
Cardiology Office Note   Date:  03/05/2015   ID:  Michele Rogers, DOB 03/04/1935, MRN [id number]  PCP:  Wynona DoveWALKER,Michele AZBELL, MD  Cardiologist:   Michele Rogers, Michele J, MD   Chief Complaint  Patient presents with  . Follow-up    HTN   1. Hypertension  History of Present Illness:  Michele Rogers is a 80 yo female from [place] who is seen today with her granddaughter. They are saddened today due to the death of her Michele Rogers ( granddaughter's brother) He was found dead in his bed yesterday.  She's done very well on the Losartan. Initially she was having some difficulty in getting Medicaid to improve the Losartan but she developed a cough with the lisinopril and they ultimately have approved the losartan.  Her blood pressures been doing well. She's been avoiding extra salt.  May 03, 2013:  Michele Rogers has had some dizziness back in February. The episodes have resolved.  She has not been exercising - is about to start.   August 10, 2012:  Michele Rogers is doing well. We prescribed Hydrochlorothiazide and potassium chloride during her last visit. She took both of these for one month and then never had the prescriptions refilled. She complains of some mild leg swelling.  She's scheduled to leave for [place] in September and will stay through January. She's not having any cardiac complaints.  Feb. 23, 2015:  Michele Rogers is doing OK. Occasional ankle edema and mild headaches.   August 23, 2014:  March 05, 2015:  Michele Rogers is a 80 y.o. female who presents for follow-up of her hypertension. Was seen with Delphia GratesElizabeth Rogers    Past Medical History  Diagnosis Date  . Hypertension   . Hyperlipidemia   . Headache(784.0)   . Arthritis   . Chest pain     Past Surgical History  Procedure Laterality Date  . Abdominal hysterectomy      complete  . Cholecystectomy    . Vaginal delivery      5  . Arm surgery    . Humerus fracture surgery      left arm     Current Outpatient  Prescriptions  Medication Sig Dispense Refill  . aspirin 81 MG tablet Take 81 mg by mouth daily.    Marland Kitchen. atorvastatin (LIPITOR) 40 MG tablet TOME MEDIA TABLETA (1/2) POR VIA ORAL TODOS LOS DIAS 45 tablet 3  . hydrochlorothiazide (HYDRODIURIL) 25 MG tablet TAKE 1 TABLET (25 MG TOTAL) BY MOUTH DAILY. 90 tablet 3  . KLOR-CON 10 10 MEQ tablet TOME UNA TABLETA POR VIA ORAL TODOS LOS DIAS 90 tablet 3  . loratadine (CLARITIN) 10 MG tablet Take 1 tablet (10 mg total) by mouth daily. 30 tablet 11  . losartan (COZAAR) 100 MG tablet Take 1 tablet (100 mg total) by mouth daily. 90 tablet 3  . metoprolol (LOPRESSOR) 100 MG tablet TAKE 1/2 A TABLET BY MOUTH 2 TIMES DAILY. 90 tablet 4  . potassium chloride (K-DUR,KLOR-CON) 10 MEQ tablet Take 1 tablet (10 mEq total) by mouth daily. 90 tablet 3   No current facility-administered medications for this visit.    Allergies:   Lisinopril    Social History:  The patient  reports that she has never smoked. She has never used smokeless tobacco. She reports that she does not drink alcohol or use illicit drugs.   Family History:  The patient's family history includes Cancer in her sister.    ROS:  Please see the history of present illness.  Review of Systems: Constitutional:  denies fever, chills, diaphoresis, appetite change and fatigue.  HEENT: denies photophobia, eye pain, redness, hearing loss, ear pain, congestion, sore throat, rhinorrhea, sneezing, neck pain, neck stiffness and tinnitus.  Respiratory: denies SOB, DOE, cough, chest tightness, and wheezing.  Cardiovascular: denies chest pain, palpitations and leg swelling.  Gastrointestinal: denies nausea, vomiting, abdominal pain, diarrhea, constipation, blood in stool.  Genitourinary: denies dysuria, urgency, frequency, hematuria, flank pain and difficulty urinating.  Musculoskeletal: denies  myalgias, back pain, joint swelling, arthralgias and gait problem.   Skin: denies pallor, rash and wound.    Neurological: denies dizziness, seizures, syncope, weakness, light-headedness, numbness and headaches.   Hematological: denies adenopathy, easy bruising, personal or family bleeding history.  Psychiatric/ Behavioral: denies suicidal ideation, mood changes, confusion, nervousness, sleep disturbance and agitation.       All other systems are reviewed and negative.    PHYSICAL EXAM: VS:  BP 138/70 mmHg  Pulse 80  Ht  (1.575 m)  Wt 137 lb 1.9 oz (62.197 kg)  BMI 25.07 kg/m2 , BMI Body mass index is 25.07 kg/(m^2). GEN: Well nourished, well developed, in no acute distress HEENT: normal Neck: no JVD, carotid bruits, or masses Cardiac: RRR; no murmurs, rubs, or gallops,no edema  Respiratory:  clear to auscultation bilaterally, normal work of breathing GI: soft, nontender, nondistended, + BS MS: no deformity or atrophy Skin: warm and dry, no rash Neuro:  Strength and sensation are intact Psych: normal   EKG:  EKG is not ordered today.    Recent Labs: No results found for requested labs within last 365 days.    Lipid Panel    Component Value Date/Time   CHOL 156 05/30/2013 1642   CHOL 163 05/25/2012 0905   TRIG 238.0* 05/30/2013 1642   HDL 50.00 05/30/2013 1642   HDL 66 05/25/2012 0905   CHOLHDL 3 05/30/2013 1642   CHOLHDL 2.5 05/25/2012 0905   VLDL 47.6* 05/30/2013 1642   LDLCALC 79 05/25/2012 0905   LDLCALC 67 12/01/2011 1022   LDLDIRECT 81.1 05/30/2013 1642      Wt Readings from Last 3 Encounters:  03/05/15 137 lb 1.9 oz (62.197 kg)  08/23/14 144 lb 8 oz (65.545 kg)  03/17/14 142 lb (64.411 kg)      Other studies Reviewed: Additional studies/ records that were reviewed today include: . Review of the above records demonstrates:    ASSESSMENT AND PLAN:  1.  Essential hypertension: Michele Rogers is doing very well. It appears that she's taking 2 different kinds of potassium chloride. One of the forms of potassium is a very large pill that is difficult to  swallow. She has a second prescription for potassium chloride that is smaller and easier to swallow. She would prefer to take the smaller tablet. She does not know which one. We'll check level today to make sure that her potassium levels okay. She will come back with her granddaughter tomorrow and will bring her medications and we should be on tell which medication she was to continue in which when we should stop.  I'll see her again in 6 months for follow-up visit.   Current medicines are reviewed at length with the patient today.  The patient does not have concerns regarding medicines.  The following changes have been made:  no change   Disposition:   FU with me in 6 months     Signed, Rogers, Deloris Ping, MD  03/05/2015 11:47 AM    [facility]  [address], [place], [place]  [address] Phone: [phone]; Fax: [phone]    

## 2015-03-05 NOTE — Patient Instructions (Addendum)
We will draw labs today: lipid, liver, BMET  Your physician wants you to follow-up in: 6 months  You will receive a reminder letter in the mail two months in advance.  If you don't receive a letter, please call our office to schedule the follow-up appointment.

## 2015-03-06 ENCOUNTER — Telehealth: Payer: Self-pay | Admitting: Cardiovascular Disease

## 2015-03-06 LAB — LIPID PANEL
CHOL/HDL RATIO: 3.3 ratio (ref 0.0–4.4)
Cholesterol, Total: 170 mg/dL (ref 100–199)
HDL: 51 mg/dL (ref >39)
LDL Calculated: 64 mg/dL (ref 0–99)
Triglycerides: 276 mg/dL — ABNORMAL HIGH (ref 0–149)
VLDL Cholesterol Cal: 55 mg/dL — ABNORMAL HIGH (ref 5–40)

## 2015-03-06 LAB — BASIC METABOLIC PANEL
BUN/Creatinine Ratio: 23 (ref 11–26)
BUN: 20 mg/dL (ref 8–27)
CALCIUM: 9.3 mg/dL (ref 8.7–10.3)
CO2: 24 mmol/L (ref 18–29)
CREATININE: 0.88 mg/dL (ref 0.57–1.00)
Chloride: 101 mmol/L (ref 97–108)
GFR calc Af Amer: 73 mL/min/{1.73_m2} (ref >59)
GFR calc non Af Amer: 64 mL/min/{1.73_m2} (ref >59)
Glucose: 122 mg/dL — ABNORMAL HIGH (ref 65–99)
Potassium: 4.9 mmol/L (ref 3.5–5.2)
SODIUM: 143 mmol/L (ref 134–144)

## 2015-03-06 LAB — HEPATIC FUNCTION PANEL
ALT: 15 IU/L (ref 0–32)
AST: 22 IU/L (ref 0–40)
Albumin: 3.9 g/dL (ref 3.5–4.8)
Alkaline Phosphatase: 99 IU/L (ref 39–117)
Bilirubin Total: 0.8 mg/dL (ref 0.0–1.2)
Bilirubin, Direct: 0.21 mg/dL (ref 0.00–0.40)
TOTAL PROTEIN: 7.2 g/dL (ref 6.0–8.5)

## 2015-03-06 NOTE — Telephone Encounter (Signed)
Spoke w/ Vernona RiegerLaura.  Reviewed meds w/ her.  She reports that pt stated that she misunderstood and is only taking 1 K+ pill. Pt's med list updated.  Asked her to call back w/ any questions or concerns.

## 2015-03-06 NOTE — Telephone Encounter (Signed)
Patient was seen by Nahser 03-05-15 with Interpretor. Granddaughter calling to get directions from nurse for care plan on AVS.  Per granddaughter there was written instruction to call and speak with nurse about medications and changes.  Please call Vernona RiegerLaura [phone].

## 2015-04-22 NOTE — Consult Note (Signed)
General Aspect Pt is an elderly hispanic female with a hx of HTN and hyperlipidemia who fell and broke her left wrist.  we are consulted for pre-op evaluation.    Present Illness [name] is a 80 yo originally from [place].  She has a hx of HTN that has been fairly well controlled.  She had a stress myoview study in November, 2012 that was normal.  She has not had any recent problems.  She was on a step ladder yesterday, fell and broke her left wrist.  Past Medical History   Diagnosis    ???  Hypertension     ???  Hyperlipidemia     ???  Headache     ???  Arthritis     ???  Chest pain         Past Surgical History   Procedure  Date   ???  Abdominal hysterectomy         complete   ???  Cholecystectomy     ???  Vaginal delivery         5       History   Smoking status   ???  Never Smoker    Smokeless tobacco   ???  Never Used       History   Alcohol Use   ???  Yes       once a week       Family History   Problem  Relation     ???  Cancer  Sister         colon   Physical Exam:   GEN well developed, well nourished, no acute distress    HEENT pink conjunctivae, hearing intact to voice, moist oral mucosa    NECK supple  No masses    RESP normal resp effort  clear BS    CARD Regular rate and rhythm  Normal, S1, S2    ABD denies tenderness  denies Flank Tenderness  no liver/spleen enlargement    LYMPH negative neck    EXTR negative cyanosis/clubbing, left wrist is in a splint    SKIN normal to palpation    NEURO cranial nerves intact, follows commands    PSYCH alert, A+O to time, place, person   Review of Systems:   Subjective/Chief Complaint c/o pain in left wrist    General: No Complaints    Skin: No Complaints    ENT: No Complaints    Eyes: No Complaints    Neck: No Complaints    Respiratory: No Complaints    Cardiovascular: occasional chest pain    Gastrointestinal: No Complaints    Genitourinary: No Complaints     htn:     hysterectomy:    chole:   Home Medications: Medication Instructions Status  losartan 50 mg oral tablet 1 tab(s) orally once a day Active  atorvastatin 40 mg oral tablet 0.5 tab(s) orally once a day Active  nitroglycerin 0.4 mg sublingual tablet 1 tab(s) sublingual , As Needed Active   Routine Hem:  23-Apr-13 13:02    WBC (CBC) 10.1   RBC (CBC) 3.81   Hemoglobin (CBC) 12.3   Hematocrit (CBC) 36.8   Platelet Count (CBC) 216   MCV 97   MCH 32.3   MCHC 33.4   RDW 12.9   Neutrophil % 77.4   Lymphocyte % 17.0   Monocyte % 4.3   Eosinophil % 0.7   Basophil % 0.6   Neutrophil # 7.8   Lymphocyte #  1.7   Monocyte # 0.4   Eosinophil # 0.1   Basophil # 0.1  Routine Chem:  23-Apr-13 13:02    Glucose, Serum 135   BUN 18   Creatinine (comp) 0.85   Sodium, Serum 141   Potassium, Serum 4.1   Chloride, Serum 103   CO2, Serum 30   Calcium (Total), Serum 8.6  Hepatic:  23-Apr-13 13:02    Bilirubin, Total 1.3   Alkaline Phosphatase 101   SGPT (ALT) 22   SGOT (AST) 19   Total Protein, Serum 8.0   Albumin, Serum 3.7  Routine Chem:  23-Apr-13 13:02    Osmolality (calc) 285   eGFR (African American) >60   eGFR (Non-African American) >60   Anion Gap 8  Routine Coag:  23-Apr-13 13:02    Prothrombin 11.6   INR 0.8  Cardiac:  23-Apr-13 18:45    CK, Total 106   Troponin I < 0.02   CPK-MB, Serum 1.4  24-Apr-13 02:36    CK, Total 101   Troponin I < 0.02   CPK-MB, Serum 1.3   EKG:   EKG Interp. by me    Interpretation NSR. No ST or T wave abnormalities.   Radiology Results: XRay:    23-Apr-13 11:40, Forearm Left   Forearm Left    REASON FOR EXAM:    trauma, fall, swelling  COMMENTS:   LMP: Post-Menopausal    PROCEDURE: DXR - DXR FOREARM LEFT  - Apr 20 2012 11:40AM     RESULT: Images of the left radius and ulna demonstrate comminuted   fracture in the distal radius extending to the articular surface. True   orthogonal views were not obtained. There is one half  shaft width   posterior disc placement and anterior angulation in the distal radius.   The ulna is poorly demonstrated but appears to have less displaced and   slightly distracted fracture present.    IMPRESSION:  Distal left radial and ulnar fractures as described.    Dictation Site: 2    Verified By: Sundra Aland, M.D., MD    No Known Allergies:     Impression Pt has a hx of CP.  She had a stress myoview which was negative for ischemia.  She has no ECG changes and her cardiac enzymes are negative.  She is a low risk for orthopedic surgery.   She should be able to go home tomorrow.  I will see her in follow up in the office.    Plan Pt is a low risk for orthopedic surgery.  Proceed with surgery today.  Call for further problems.   Electronic Signatures: Nahser, Dreama Saa (MD)  (Signed 24-Apr-13 08:26)  Authored: General Aspect/Present Illness, History and Physical Exam, Review of System, Past Medical History, Home Medications, Labs, EKG , Radiology, Allergies, Impression/Plan   Last Updated: 24-Apr-13 08:26 by Nahser, Dreama Saa (MD)

## 2015-04-22 NOTE — Op Note (Signed)
PATIENT NAME:  Michele Rogers, Michele Rogers MR#:  [id number] DATE OF BIRTH:  1935-03-04  DATE OF PROCEDURE:  04/22/2012  PREOPERATIVE DIAGNOSIS: Distal radius and ulna fracture, left forearm.   POSTOPERATIVE DIAGNOSIS: Distal radius and ulna fracture, left forearm.   PROCEDURE: Open reduction internal fixation of distal radius.   SURGEON: Winn JockJames C. Georgeanne Frankland, MD  ASSISTANT: None.   ANESTHESIA: General.   ESTIMATED BLOOD LOSS: Negligible.   COMPLICATIONS: None.   TOURNIQUET: Not inflated.   IMPLANTS USED: DePuy DVR volar plate.   BRIEF CLINICAL NOTE AND PATHOLOGY: The patient suffered the above-mentioned fracture on 04/23. She was brought to the preoperative area for surgery at which time she developed tachycardia and chest pain. She was admitted to the hospital to rule out MI. She subsequently was cleared and brought to the Operating Room where the fracture reduced nicely. Fixation was good. The patient did have history of a remote distal radius fracture many years ago for which she did not seek medical attention.   DESCRIPTION OF PROCEDURE: Adequate general anesthesia, supine position, preoperative antibiotics, routine prepping and draping. Appropriate timeout was called.   Routine volar incision was made in line with the flexor carpi radialis tendon crossing the wrist crease at an oblique angle. The flexor carpi radialis tendon sheath was opened, tendon retracted to the ulnar aspect. The interval was developed down to the volar aspect of the radius by elevating the pronator. Neurovascular structures were protected.   The fracture was reduced with direct visualization. Fluoroscopy was used to confirm this. The plate was then applied in routine fashion initially securing the plate to the radius with cortical screws. Position adjusted appropriately. Distal pegs were then placed. All had excellent purchase. AP, lateral, and oblique views showed good positioning. The remaining two cortical screws  were then placed in the plate. Hemostasis was obtained with the bipolar cautery throughout the incision. Incision was thoroughly irrigated. The pronator was allowed to return to its position. The sub-Q was closed with 2-0 Vicryl, skin closed with 4-0 Monocryl. It was injected with Marcaine. Soft sterile dressing was applied followed by a wrist splint. Patient was awakened, taken to the postanesthesia care unit having tolerated procedure well. Sponge and needle counts reported as correct prior to and after wound closure.    ____________________________ Winn JockJames C. Gerrit Heckaliff, MD jcc:cms D: 04/23/2012 06:10:00 ET T: 04/23/2012 10:30:34 ET JOB#: [id number]  cc: Winn JockJames C. Gerrit Heckaliff, MD, <Dictator> Winn JockJAMES C Petar Mucci MD ELECTRONICALLY SIGNED 04/24/2012 6:47

## 2015-04-22 NOTE — Consult Note (Signed)
PATIENT NAME:  Michele Rogers, Michele Rogers MR#:  [id number] DATE OF BIRTH:  June 01, 1935  DATE OF CONSULTATION:  04/20/2012  REFERRING PHYSICIAN:  Dr. Ruthann Cancer  CONSULTING PHYSICIAN:  Rolly Pancake. Cherlynn Kaiser, MD  PRIMARY CARE PHYSICIAN: Dr. Ronna Polio  REASON FOR CONSULTATION: Chest pain.   HISTORY OF PRESENT ILLNESS: This is a 80 year old female who presented to the hospital today after suffering a mechanical fall. Patient was on a bench putting some mild laundry up when she fell. She presented to the hospital with a distal left radial fracture. She was going to be taken to surgery but preoperatively she started to develop some chest pain. On further questioning, patient says she has been having on and off chest pain for quite a while. She describes the pain more on the left side of her breast, nonradiating. Associated with some shortness of breath and some dizziness but no nausea, no vomiting, no diaphoresis and no other associated symptoms. Here in the preop area patient was given some sublingual nitroglycerin with some slight improvement of her chest pain. Patient apparently has a recent stress test done interpreted by Dr. Mariah Milling here in November 2012 which was essentially normal. Given the fact that the recurrence of her symptoms surgery was held and hospitalist service was contacted for preoperative evaluation and cardiologic clearance.   REVIEW OF SYSTEMS: CONSTITUTIONAL: No documented fever. No weight gain. No weight loss. EYES: No blurred or double vision. ENT: No tinnitus. No postnasal drip. No redness of the oropharynx. RESPIRATORY: No cough, no wheeze, no hemoptysis. CARDIOVASCULAR: Positive chest pain. No orthopnea, no palpitations, no syncope. GASTROINTESTINAL: No nausea, no vomiting, no diarrhea, no abdominal pain, no melena, no hematochezia. GENITOURINARY: No dysuria, no hematuria. ENDOCRINE: No polyuria or nocturia. No heat or cold intolerance. HEME: No anemia. No bruising. No bleeding.  INTEGUMENTARY: No rashes. No lesions. MUSCULOSKELETAL: No arthritis, no swelling, no gout. NEUROLOGIC: No numbness, no tingling, no ataxia, no seizure-type activity. PSYCH: No anxiety, no insomnia, no ADD.   PAST MEDICAL HISTORY:  1. Hypertension.  2. Hyperlipidemia.  3. History of previous transient ischemic attacks.   ALLERGIES: No known drug allergies.   SOCIAL HISTORY: No smoking. No alcohol abuse. No illicit drug abuse. Lives with her granddaughter.   FAMILY HISTORY: No significant family history of coronary artery disease or diabetes. Both mother and father died from complications of old age.   CURRENT MEDICATIONS:  1. Sublingual nitroglycerin as needed.  2. Atorvastatin 40 mg daily. 3. Losartan 50 mg daily.   PHYSICAL EXAMINATION:  VITAL SIGNS: Temperature she is afebrile, pulse 92, respirations 18, blood pressure 137/73, stats 98% on 2 liters nasal cannula.   GENERAL: She is a pleasant appearing female in no apparent distress.   HEENT: She is atraumatic, normocephalic. Her extraocular muscles are intact. Pupils equal, reactive to light. Sclerae anicteric. No conjunctival injection. No oropharyngeal erythema.   NECK: Supple. There is no jugular venous distention. No bruits. No lymphadenopathy or thyromegaly.   HEART: Regular rate and rhythm. No murmurs, no rubs, no clicks.   LUNGS: Clear to auscultation bilaterally. No rales, no rhonchi, no wheezes.   ABDOMEN: Soft, flat, nontender, nondistended. Has good bowel sounds. No hepatosplenomegaly appreciated.   EXTREMITIES: No evidence of any cyanosis, clubbing, or peripheral edema. Has +2 pedal and radial pulses bilaterally. She has left arm in a sling due to fracture.   NEUROLOGIC: She is alert, awake, oriented x3 with no focal motor or sensory deficits appreciated bilaterally.   SKIN: Moist, warm with  no rashes.   LYMPHATIC: There is no cervical or axillary lymphadenopathy.   LABORATORY, DIAGNOSTIC, AND RADIOLOGICAL DATA:  Serum glucose 135, BUN 18, creatinine 0.8, sodium 141, potassium 4.1, chloride 103, bicarbonate 30. LFTs are within normal limits. White cell count 10.1, hemoglobin 12.3, hematocrit 36.8, platelet count 216, prothrombin time 11.6, INR 0.8.   EKG showed normal sinus rhythm with normal axis and no evidence of any acute ST or T wave changes.   X-ray of her left forearm showed distal left radial and ulnar fractures.   ASSESSMENT AND PLAN: This is a 80 year old female with past medical history of hypertension, hyperlipidemia, previous history of transient ischemic attack presents to the hospital after suffering a fall and suffering a left forearm distal radial fracture. Preoperatively she was noted to have some chest pain.  1. Chest pain. The etiology of this is currently unclear. Questionable if this is musculoskeletal versus cardiac. She did have a stress test done in November 2012 which was essentially normal. For now will place her on off unit telemetry. EKG does not show any acute ST or T wave changes. Continue aspirin, nitroglycerin, morphine, oxygen. Will start her on some low dose beta blockers for now. Cycle her cardiac markers. Obtain a cardiology consult through the [facility] as they have interpreted her stress test in the morning.  2. Hypertension, presently hemodynamically stable. Continue losartan and will start low dose beta blockers. 3. Hyperlipidemia. Continue atorvastatin.  4. Distal left radial fracture. Continue care as per orthopedics. Likely will need cardiac clearance prior to surgery.  5. CODE STATUS: Patient is a FULL CODE.   TIME SPENT WITH THE CONSULTATION: 50 minutes.  ____________________________ Rolly PancakeVivek J. Cherlynn KaiserSainani, MD vjs:cms D: 04/20/2012 18:09:27 ET T: 04/21/2012 07:59:46 ET  JOB#: [id number] cc: Rolly PancakeVivek J. Cherlynn KaiserSainani, MD, <Dictator> Ginette PitmanJennifer A. Dan HumphreysWalker, MD Houston SirenVIVEK J SAINANI MD ELECTRONICALLY SIGNED 04/24/2012 7:54

## 2015-04-22 NOTE — H&P (Signed)
Subjective/Chief Complaint Left arm pain    History of Present Illness Fell this AM, fractured left forearm. Fractured 18 yrs ago with no medical attention and had some deformity. Mild elbow pain    Past History HTN    Past Medical Health Hypertension   Past Med/Surgical Hx:  htn:   hysterectomy:   chole:   ALLERGIES:  No Known Allergies:   HOME MEDICATIONS: Medication Instructions Status  losartan 50 mg oral tablet 1 tab(s) orally once a day Active  atorvastatin 40 mg oral tablet 0.5 tab(s) orally once a day Active  nitroglycerin 0.4 mg sublingual tablet 1 tab(s) sublingual , As Needed Active   Family and Social History:   Family History Non-Contributory    Social History negative tobacco, positive ETOH, negative Illicit drugs    Place of Living Home   Review of Systems:   Fever/Chills No    Cough No    Sputum No    Abdominal Pain No    Diarrhea No    Constipation No    Nausea/Vomiting No    SOB/DOE No    Chest Pain No    Dysuria No    Tolerating Diet Yes   Physical Exam:   GEN well developed    HEENT PERRL    NECK supple    RESP clear BS    CARD regular rate  no murmur    ABD denies tenderness  soft  normal BS    EXTR Pain and sweling left wrist. Elbow slightly tender    SKIN normal to palpation    NEURO motor/sensory function intact    PSYCH A+O to time, place, person   Routine Hem:  23-Apr-13 13:02    WBC (CBC) 10.1   RBC (CBC) 3.81   Hemoglobin (CBC) 12.3   Hematocrit (CBC) 36.8   Platelet Count (CBC) 216   MCV 97   MCH 32.3   MCHC 33.4   RDW 12.9   Neutrophil % 77.4   Lymphocyte % 17.0   Monocyte % 4.3   Eosinophil % 0.7   Basophil % 0.6   Neutrophil # 7.8   Lymphocyte # 1.7   Monocyte # 0.4   Eosinophil # 0.1   Basophil # 0.1  Routine Chem:  23-Apr-13 13:02    Glucose, Serum 135   BUN 18   Creatinine (comp) 0.85   Sodium, Serum 141   Potassium, Serum 4.1   Chloride, Serum 103   CO2, Serum 30   Calcium  (Total), Serum 8.6  Hepatic:  23-Apr-13 13:02    Bilirubin, Total 1.3   Alkaline Phosphatase 101   SGPT (ALT) 22   SGOT (AST) 19   Total Protein, Serum 8.0   Albumin, Serum 3.7  Routine Chem:  23-Apr-13 13:02    Osmolality (calc) 285   eGFR (African American) >60   eGFR (Non-African American) >60   Anion Gap 8  Routine Coag:  23-Apr-13 13:02    Prothrombin 11.6   INR 0.8     Assessment/Admission Diagnosis 1. Left distal radius/ulna fracture with some preexisting deformity. Discussed options, will proceed with ORIF 2. HTN, stable 3. Nl stress test    Plan ORIF left distal radius   Electronic Signatures: Lynnda Shields (MD)  (Signed 23-Apr-13 14:05)  Authored: CHIEF COMPLAINT and HISTORY, PAST MEDICAL/SURGIAL HISTORY, ALLERGIES, HOME MEDICATIONS, FAMILY AND SOCIAL HISTORY, REVIEW OF SYSTEMS, PHYSICAL EXAM, LABS, ASSESSMENT AND PLAN   Last Updated: 23-Apr-13 14:05 by Lynnda Shields (MD)

## 2015-04-22 NOTE — Consult Note (Signed)
Brief Consult Note: Diagnosis: 1. Chest Pain 2. hx of HTN 3. Hyperlipidemia 4. TIA.   Patient was seen by consultant.   Consult note dictated.   Recommend further assessment or treatment.   Orders entered.   Discussed with Attending MD.   Comments: 80 yo female w/ hx of HTN, Hyperlipidemia, hx of previous TIA came into hospital after a fall and left distal radial fracture.  Pre-op pt. noted to have some Chest pain.   1. Chest Pain - ?? etiology. Musculoskeletal (vs) Cardiac.  Had stress test in Nov''12 which was normal.  - will place on off unit tele.  ECG is NSR.  Cont. ASA, Nitro, Morphine, O2.  - cycle cardiac markers.  Cardiology consult Corinda Gubler([place]) in a.m.   2. HTN - cont. Losartan and start low dose B-blocker 3. Hyperlipidemia - cont. Atorvastatin 4. Left Radial fracture - cont. care as per Orthopedics. Needs cardiac clearance prior to surgery.   Full Code  Job #.  Electronic Signatures: [place] SirenSainani, Vivek J (MD)  (Signed 23-Apr-13 18:03)  Authored: Brief Consult Note   Last Updated: 23-Apr-13 18:03 by [place] SirenSainani, Vivek J (MD)

## 2015-04-22 NOTE — Discharge Summary (Signed)
PATIENT NAME:  Michele Rogers, Michele Rogers MR#:  [id number] DATE OF BIRTH:  1935-04-25  DATE OF ADMISSION:  04/20/2012 DATE OF DISCHARGE:  04/23/2012  HISTORY OF PRESENT ILLNESS: 80 year old female who fell on the day of admission suffering a displaced fracture of her radius and ulna. This was on the left side. She had a remote fracture 18 years ago for which she did not seek any medical attention. She is accompanied by her daughter who speaks [place]. The patient speaks minimal [place].   She denies any other injuries. She denies truly blacking out but has had some episodes in the past where she has blacked out. She denies any shortness of breath. She denies any fevers, chills or constitutional symptoms.   The patient was evaluated in the Emergency Room and admitted without medical consultation as her EKG looked good.   PAST MEDICAL HISTORY:  1. Hypertension.  2. Hysterectomy.  3. Cholecystectomy.   ALLERGIES: None.   MEDICATIONS:  1. Losartan. 2. Atorvastatin. 3. Nitroglycerin.   SOCIAL HISTORY: Does drink alcohol. No tobacco or illicit drugs.   FAMILY HISTORY: She has family members.   REVIEW OF SYSTEMS: Unremarkable for any acute cardiorespiratory, GI, or GU symptoms. No fever, chills, or constitutional symptoms.   PHYSICAL EXAMINATION: Examination was unremarkable except for the left wrist which showed swelling and deformity. Neurovascular examination was intact. Remainder of physical examination as per history of present illness.   LABORATORY, DIAGNOSTIC AND RADIOLOGICAL DATA: Laboratory work was unremarkable. EKG showed no acute changes.   HOSPITAL COURSE: The patient was admitted after she was brought to the OR for surgery and she began to develop some palpitations and chest pain. It is felt that after her cardiac consultation that she needed to be evaluated further before surgery. She subsequently underwent cardiac evaluation with enzymes that were unremarkable. She was felt to  be cleared for surgery. She was brought to the Operating Room where she underwent open reduction internal fixation. Postoperatively she did well.   CONDITION ON DISCHARGE: Good.   PROGNOSIS: Good.   DISCHARGE INSTRUCTIONS:  1. Diet is regular.  2. Physical activity limitations are to avoid heavy use of the arm. She is to keep the arm elevated. Use the fingers for exercise.  3. Patient understands cast precautions.  MEDICATIONS ON DISCHARGE: Same as admission plus Percocet 1 q.4-6 hours p.r.n. pain.    DIAGNOSES:  1. Displaced left distal radius and ulna fracture.  2. Atypical chest pain with palpitations.   OPERATIVE PROCEDURE: Open reduction internal fixation of left distal radius.   SURGEON: Winn JockJames C. Alexxander Kurt.  ASSISTANT: None.     FOLLOW UP: [facility] in approximately one week to 10 days.   ____________________________ Winn JockJames C. Gerrit Heckaliff, MD jcc:cms D: 04/29/2012 11:01:30 ET T: 04/29/2012 11:18:22 ET JOB#: [id number]  cc: Winn JockJames C. Gerrit Heckaliff, MD, <Dictator> Winn JockJAMES C Kylee Nardozzi MD ELECTRONICALLY SIGNED 04/29/2012 11:30

## 2015-05-21 ENCOUNTER — Encounter (INDEPENDENT_AMBULATORY_CARE_PROVIDER_SITE_OTHER): Payer: Self-pay

## 2015-05-21 ENCOUNTER — Encounter: Payer: Self-pay | Admitting: Nurse Practitioner

## 2015-05-21 ENCOUNTER — Ambulatory Visit (INDEPENDENT_AMBULATORY_CARE_PROVIDER_SITE_OTHER): Payer: Medicaid Other | Admitting: Nurse Practitioner

## 2015-05-21 VITALS — BP 128/70 | HR 79 | Temp 98.2°F | Resp 12 | Ht 62.0 in | Wt 137.8 lb

## 2015-05-21 DIAGNOSIS — W102XXA Fall (on)(from) incline, initial encounter: Secondary | ICD-10-CM | POA: Diagnosis not present

## 2015-05-21 DIAGNOSIS — R569 Unspecified convulsions: Secondary | ICD-10-CM

## 2015-05-21 NOTE — Assessment & Plan Note (Signed)
Pt is watched by her family closely. She does not go anywhere without someone. We discussed options and decided on a referral to Neurology for further work up. This may, however, be an isolated incident. We discussed how she takes her medications, denies new medications. She is A&O x 3. She has no further complaints. Referred [facility]. Will follow.

## 2015-05-21 NOTE — Progress Notes (Signed)
Subjective:    Patient ID: Ardelle ParkMaria O Manni, female    DOB: 07/02/1935, 80 y.o.   MRN: [id number]  HPI  Ms. Cay Schillingsastrana is a 80 yo female with a CC of fall from stool yesterday. She is accompanied by her Granddaughter and a Nurse, learning disabilitytranslator.   1) Patient is concerned about her episode yesterday. She was sitting at the counter on a high stool. Her daughter was cooking and turned around for a second and they report she saw the patients feet in the air and she was down. She fell into the stools next to her before falling off. Granddaughter reports the mother saw her eyes roll back and she began to shake for a few seconds. The pt reports she had no recollection of what happened and was more concerned because her daughter was holding her and crying. She was confused for short period afterwards and felt find after getting up and walking around. There was no report of hitting her head.     Review of Systems  Constitutional: Negative for fever, chills, diaphoresis and fatigue.  Respiratory: Negative for chest tightness, shortness of breath and wheezing.   Cardiovascular: Negative for chest pain, palpitations and leg swelling.  Gastrointestinal: Negative for nausea, vomiting and diarrhea.  Skin: Negative for rash.  Neurological: Positive for seizures. Negative for dizziness, weakness, numbness and headaches.       Family feels this was a seizure. No hx of seizures  Psychiatric/Behavioral: The patient is not nervous/anxious.    Past Medical History  Diagnosis Date  . Hypertension   . Hyperlipidemia   . Headache(784.0)   . Arthritis   . Chest pain     History   Social History  . Marital Status: Widowed    Spouse Name: N/A  . Number of Children: N/A  . Years of Education: N/A   Occupational History  . Not on file.   Social History Main Topics  . Smoking status: Never Smoker   . Smokeless tobacco: Never Used  . Alcohol Use: No     Comment: once a week  . Drug Use: No  . Sexual Activity:  Not on file   Other Topics Concern  . Not on file   Social History Narrative   [place] in [place] from [place]. Lives with daughter, granddaughter. No pets.     Past Surgical History  Procedure Laterality Date  . Abdominal hysterectomy      complete  . Cholecystectomy    . Vaginal delivery      5  . Arm surgery    . Humerus fracture surgery      left arm    Family History  Problem Relation Age of Onset  . Cancer Sister     colon    Allergies  Allergen Reactions  . Lisinopril Cough    Current Outpatient Prescriptions on File Prior to Visit  Medication Sig Dispense Refill  . aspirin 81 MG tablet Take 81 mg by mouth daily.    Marland Kitchen. atorvastatin (LIPITOR) 40 MG tablet TOME MEDIA TABLETA (1/2) POR VIA ORAL TODOS LOS DIAS 45 tablet 3  . hydrochlorothiazide (HYDRODIURIL) 25 MG tablet TAKE 1 TABLET (25 MG TOTAL) BY MOUTH DAILY. 90 tablet 3  . KLOR-CON 10 10 MEQ tablet TOME UNA TABLETA POR VIA ORAL TODOS LOS DIAS 90 tablet 3  . loratadine (CLARITIN) 10 MG tablet Take 1 tablet (10 mg total) by mouth daily. 30 tablet 11  . losartan (COZAAR) 100 MG tablet Take 1 tablet (  100 mg total) by mouth daily. 90 tablet 3  . metoprolol (LOPRESSOR) 100 MG tablet TAKE 1/2 A TABLET BY MOUTH 2 TIMES DAILY. 90 tablet 4   No current facility-administered medications on file prior to visit.      Objective:   Physical Exam  Constitutional: She is oriented to person, place, and time. She appears well-developed and well-nourished. No distress.  BP 128/70 mmHg  Pulse 79  Temp(Src) 98.2 F (36.8 C) (Oral)  Resp 12  Ht  (1.575 m)  Wt 137 lb 12.8 oz (62.506 kg)  BMI 25.20 kg/m2  SpO2 97%   HENT:  Head: Normocephalic and atraumatic.  Right Ear: External ear normal.  Left Ear: External ear normal.  Eyes: EOM are normal. Pupils are equal, round, and reactive to light.  Neck: Normal range of motion. Neck supple.  Cardiovascular: Normal rate, regular rhythm, normal heart sounds and intact  distal pulses.  Exam reveals no gallop and no friction rub.   No murmur heard. Pulmonary/Chest: Effort normal and breath sounds normal. No respiratory distress. She has no wheezes. She has no rales. She exhibits no tenderness.  Neurological: She is alert and oriented to person, place, and time. No cranial nerve deficit. She exhibits normal muscle tone. Coordination normal.  Skin: Skin is warm and dry. No rash noted. She is not diaphoretic.  Psychiatric: She has a normal mood and affect. Her behavior is normal. Judgment and thought content normal.      Assessment & Plan:

## 2015-05-21 NOTE — Patient Instructions (Signed)
We will contact you to schedule your visit with Neurology. This may take a week or two.   Contact us if anything else occurs.

## 2015-05-21 NOTE — Progress Notes (Signed)
Pre visit review using our clinic review tool, if applicable. No additional management support is needed unless otherwise documented below in the visit note. 

## 2015-05-21 NOTE — Addendum Note (Signed)
Addended by: Carollee LeitzSS, CARRIE M on: 05/21/2015 02:25 PM   Modules accepted: Level of Service

## 2015-06-04 ENCOUNTER — Other Ambulatory Visit: Payer: Self-pay | Admitting: Cardiovascular Disease

## 2015-06-15 ENCOUNTER — Other Ambulatory Visit: Payer: Self-pay | Admitting: Cardiovascular Disease

## 2015-06-21 ENCOUNTER — Telehealth: Payer: Self-pay

## 2015-06-21 NOTE — Telephone Encounter (Signed)
The patient's grand-daughter called hoping to get information regarding her nuerology referral.

## 2015-06-28 ENCOUNTER — Ambulatory Visit: Payer: Self-pay | Admitting: Neurology

## 2015-07-02 ENCOUNTER — Other Ambulatory Visit: Payer: Self-pay | Admitting: Cardiovascular Disease

## 2015-07-23 ENCOUNTER — Ambulatory Visit (INDEPENDENT_AMBULATORY_CARE_PROVIDER_SITE_OTHER): Payer: Medicaid Other | Admitting: Neurology

## 2015-07-23 ENCOUNTER — Encounter: Payer: Self-pay | Admitting: Neurology

## 2015-07-23 VITALS — BP 130/80 | HR 72 | Ht 62.0 in | Wt 139.0 lb

## 2015-07-23 DIAGNOSIS — R569 Unspecified convulsions: Secondary | ICD-10-CM | POA: Diagnosis not present

## 2015-07-23 NOTE — Progress Notes (Signed)
Michele Rogers was seen today in neurologic consultation at the request of Wynona Dove, MD.  The patient presents today, accompanied by her granddaughter who supplements the history and helps to translate.  I reviewed her medical record as well.  She was referred about 1 month ago and had an appointment about 1 month ago but the patient canceled it and moved it to today.  The patient presents today for an episode that happened at the end of May.  She was sitting in a kitchen on a barstool, which is the last thing the patient remembers.  Pt states that she felt completely normal.  No palpitations or dizziness.  It was 1:30pm.  She fell off the stool because she lost consciousness and her right hip hit the floor; her daughter then saw her feet in the air and her head hit an oriental rug.  Both arms were flexed.  Her eyes were rolled back in the head and she had witnessed shaking for a few seconds.  She did not recall what happened but when the patient came around she asked why her daughter was screaming at her.  She was not otherwise confused.  No tongue biting.  No loss of bladder or bowel control.  Was normal the rest of the day.  No new meds, including OTC meds.  No new supplements.  No incidents since.    No hx of seizure even as a child.  Pt is product of normal term vaginal delivery.  She met developmental milestones at appropriate ages.  No history of stroke.    Admits to occ paresthesias in the L arm but otherwise no lateralizing paresthesias/weakness.  She does not drive.  She does admit to some headache and granddaughter thinks related to noncompliance with BP meds.    Neuroimaging has not previously been performed since this incident.   PREVIOUS MEDICATIONS: n/a  ALLERGIES:   Allergies  Allergen Reactions  . Lisinopril Cough    CURRENT MEDICATIONS:  Outpatient Encounter Prescriptions as of 07/23/2015  Medication Sig  . aspirin 81 MG tablet Take 81 mg by mouth daily.  Marland Kitchen  atorvastatin (LIPITOR) 40 MG tablet TOME MEDIA TABLETA (1/2) POR VIA ORAL TODOS LOS DIAS  . hydrochlorothiazide (HYDRODIURIL) 25 MG tablet TAKE 1 TABLET (25 MG TOTAL) BY MOUTH DAILY.  Marland Kitchen KLOR-CON 10 10 MEQ tablet TOME UNA TABLETA POR VIA ORAL TODOS LOS DIAS  . loratadine (CLARITIN) 10 MG tablet Take 1 tablet (10 mg total) by mouth daily.  Marland Kitchen losartan (COZAAR) 100 MG tablet Take 1 tablet (100 mg total) by mouth daily.  . metoprolol (LOPRESSOR) 100 MG tablet TAKE 1/2 A TABLET BY MOUTH 2 TIMES DAILY.  . [DISCONTINUED] atorvastatin (LIPITOR) 40 MG tablet TOME MEDIA TABLETA (1/2) POR VIA ORAL TODOS LOS DIAS  . [DISCONTINUED] KLOR-CON 10 10 MEQ tablet TOME UNA TABLETA POR VIA ORAL TODOS LOS DIAS   No facility-administered encounter medications on file as of 07/23/2015.    PAST MEDICAL HISTORY:   Past Medical History  Diagnosis Date  . Hypertension   . Hyperlipidemia   . Headache(784.0)   . Arthritis   . Chest pain     PAST SURGICAL HISTORY:   Past Surgical History  Procedure Laterality Date  . Abdominal hysterectomy      complete  . Cholecystectomy    . Vaginal delivery      5  . Humerus fracture surgery      left arm    SOCIAL HISTORY:  History   Social History  . Marital Status: Widowed    Spouse Name: N/A  . Number of Children: N/A  . Years of Education: N/A   Occupational History  . Not on file.   Social History Main Topics  . Smoking status: Never Smoker   . Smokeless tobacco: Never Used  . Alcohol Use: 0.0 oz/week    0 Standard drinks or equivalent per week     Comment: one drink every couple months  . Drug Use: No  . Sexual Activity: Not on file   Other Topics Concern  . Not on file   Social History Narrative   Lives in [place], from [place]. Lives with daughter, granddaughter. No pets.     FAMILY HISTORY:   Family Status  Relation Status Death Age  . Sister Alive     colon cancer (4 additional siblings- healthy)  . Mother Deceased     "old age"    . Father Deceased     "old age"  . Son Alive     5 kids - healthy    ROS:  A complete 10 system review of systems was obtained and was unremarkable apart from what is mentioned above.  PHYSICAL EXAMINATION:    VITALS:   Filed Vitals:   07/23/15 0911  BP: 130/80  Pulse: 72  Height: $Remove'5\' 2"'UPPZITB$  (1.575 m)  Weight: 139 lb (63.05 kg)    GEN:  Normal appears female in no acute distress.  Appears stated age. HEENT:  Normocephalic, atraumatic. The mucous membranes are moist. The superficial temporal arteries are without ropiness or tenderness. Cardiovascular: Regular rate and rhythm. Lungs: Clear to auscultation bilaterally. Neck/Heme: There are no carotid bruits noted bilaterally.  NEUROLOGICAL: Orientation:  The patient is alert and oriented x 3.  Fund of knowledge is appropriate.  Recent and remote memory intact.  Attention span and concentration normal.  Repeats and names without difficulty. Cranial nerves: There is good facial symmetry. The pupils are equal round and reactive to light bilaterally. Fundoscopic exam reveals clear disc margins bilaterally. Extraocular muscles are intact and visual fields are full to confrontational testing. Speech appears to be fluent and clear, but this examiner does not speak Spanish. Soft palate rises symmetrically and there is no tongue deviation. Hearing is intact to conversational tone. Tone: Tone is good throughout. Sensation: Sensation is intact to light touch and pinprick throughout (facial, trunk, extremities). Vibration is decreased at the bilateral big toe. There is no extinction with double simultaneous stimulation. There is no sensory dermatomal level identified.  There are no asymmetries. Coordination:  The patient has no difficulty with RAM's or FNF bilaterally. Motor: Strength is 5/5 in the bilateral upper and lower extremities.  Shoulder shrug is equal and symmetric. There is no pronator drift.  There are no fasciculations noted. DTR's: Deep  tendon reflexes are 2/4 at the bilateral biceps, triceps, brachioradialis, patella and 1/4 at the bilateral achilles.  Plantar responses are downgoing bilaterally. Gait and Station: The patient is able to ambulate without difficulty. The patient is able to heel toe walk without any difficulty. The patient has minimal difficulty ambulating in a tandem fashion. The patient is able to stand in the Romberg position.   IMPRESSION/PLAN  1. Probable first episode of seizure  -Her description does sound like seizure, but it is unclear whether this was a primary event or if this was secondary to something else.  Regardless, this deserves a further workup.  She will have an MRI of the  brain with seizure protocol.  She will have an EEG.  If that is negative, she will have an ambulatory EEG.  [place] driving laws were discussed but she does not drive anyway so not a big issue. Seizure and safety was discussed in detail.  If the above is negative, we will hold on any antiepileptic medication.  Follow up after above completed.  Much greater than 50% of this visit was spent in counseling with the patient and the family.  Total face to face time:  60 min

## 2015-07-23 NOTE — Patient Instructions (Signed)
1. We have scheduled you at [facility] for your MRI on 08/02/2015 at 12:00 pm. Please arrive 1 hour prior for labs and go to 1st floor radiology. If you need to reschedule for any reason please call [phone].

## 2015-08-01 ENCOUNTER — Other Ambulatory Visit: Payer: Self-pay | Admitting: Cardiovascular Disease

## 2015-08-02 ENCOUNTER — Ambulatory Visit (HOSPITAL_COMMUNITY)
Admission: RE | Admit: 2015-08-02 | Discharge: 2015-08-02 | Disposition: A | Discharge status: Home / Self Care | Payer: Medicaid Other | Source: Ambulatory Visit | Attending: Neurology | Admitting: Neurology

## 2015-08-02 ENCOUNTER — Encounter: Payer: Self-pay | Admitting: Neurology

## 2015-08-02 DIAGNOSIS — R569 Unspecified convulsions: Secondary | ICD-10-CM | POA: Diagnosis not present

## 2015-08-02 DIAGNOSIS — G9389 Other specified disorders of brain: Secondary | ICD-10-CM | POA: Diagnosis not present

## 2015-08-02 DIAGNOSIS — R42 Dizziness and giddiness: Secondary | ICD-10-CM | POA: Insufficient documentation

## 2015-08-02 LAB — CREATININE, SERUM
Creatinine, Ser: 0.78 mg/dL (ref 0.44–1.00)
GFR calc Af Amer: >60 mL/min (ref >60)

## 2015-08-02 MED ORDER — GADOBENATE DIMEGLUMINE 529 MG/ML IV SOLN
15.0000 mL | Freq: Once | INTRAVENOUS | Status: AC
  Administered 2015-08-02: 15 mL via INTRAVENOUS

## 2015-08-03 ENCOUNTER — Telehealth: Payer: Self-pay

## 2015-08-03 ENCOUNTER — Telehealth: Payer: Self-pay | Admitting: Neurology

## 2015-08-03 NOTE — Telephone Encounter (Signed)
-----   Message from Octaviano Batty Tat, DO sent at 08/03/2015  7:42 AM EDT ----- Reviewed and agree.  Lesly Rubenstein, you can let pt know via translator (or granddaughter if on release) that MRI brain didn't show anything new compared to brain scan in 2010.

## 2015-08-03 NOTE — Telephone Encounter (Signed)
Patient's granddaughter made aware MR stable.

## 2015-08-03 NOTE — Telephone Encounter (Signed)
PA for Losartan  obtained thru [employer]. PA # [id number]. Pharmacy notified.

## 2015-08-06 ENCOUNTER — Ambulatory Visit (INDEPENDENT_AMBULATORY_CARE_PROVIDER_SITE_OTHER): Payer: Medicaid Other | Admitting: Neurology

## 2015-08-06 DIAGNOSIS — R569 Unspecified convulsions: Secondary | ICD-10-CM | POA: Diagnosis not present

## 2015-08-08 ENCOUNTER — Telehealth: Payer: Self-pay | Admitting: Neurology

## 2015-08-08 NOTE — Telephone Encounter (Signed)
-----   Message from Michele Batty Tat, DO sent at 08/08/2015 11:57 AM EDT ----- Let pt/family know that EEG looked fine and if not already scheduled, should schedule prolonged ambulatory EEG

## 2015-08-08 NOTE — Telephone Encounter (Signed)
Patient's granddaughter made aware. 48 hour EEG scheduled on 08/20/2015.

## 2015-08-08 NOTE — Telephone Encounter (Addendum)
Left message on machine for patient's granddaughter to call back.   

## 2015-08-08 NOTE — Procedures (Signed)
ELECTROENCEPHALOGRAM REPORT  Date of Study: 08/06/2015  Patient's Name: Michele Rogers MRN: [id number] Date of Birth: 1935-11-20  Referring Provider: Dr. Lurena Joiner Tat  Clinical History: This is a 80 year old woman with new onset seizure.  Medications: Aspirin, Lipitor, Cozaar, Metoprolol, HCTZ  Technical Summary: A multichannel digital EEG recording measured by the international 10-20 system with electrodes applied with paste and impedances below 5000 ohms performed in our laboratory with EKG monitoring in an awake and drowsy patient.  Hyperventilation was not performed. Photic stimulation was performed.  The digital EEG was referentially recorded, reformatted, and digitally filtered in a variety of bipolar and referential montages for optimal display.    Description: The patient is awake and drowsy during the recording.  During maximal wakefulness, there is a poorly sustained low voltage 10  Hz posterior dominant rhythm that poorly attenuates to eye opening and eye closure.  The record is symmetric.  During drowsiness, there is an increase in theta slowing of the background, at times sharply contoured over the bilateral temporal regions, right greater than left, without clear epileptogenic potential. Deeper stages of sleep were not seen. Photic stimulation did not elicit any abnormalities.  There were no epileptiform discharges or electrographic seizures seen.    EKG lead was unremarkable.  Impression: This awake and drowsy EEG is within normal limits.  Clinical Correlation: A normal EEG does not exclude a clinical diagnosis of epilepsy.  If further clinical questions remain, prolonged EEG may be helpful.  Clinical correlation is advised.   Patrcia Dolly, M.D.

## 2015-08-16 ENCOUNTER — Encounter: Payer: Self-pay | Admitting: *Deleted

## 2015-08-16 ENCOUNTER — Ambulatory Visit: Payer: Self-pay | Admitting: Cardiovascular Disease

## 2015-08-20 ENCOUNTER — Ambulatory Visit (INDEPENDENT_AMBULATORY_CARE_PROVIDER_SITE_OTHER): Payer: Medicaid Other | Admitting: Neurology

## 2015-08-20 DIAGNOSIS — R569 Unspecified convulsions: Secondary | ICD-10-CM | POA: Diagnosis not present

## 2015-08-28 NOTE — Progress Notes (Signed)
Jade, let pt know that 48 hour EEG looked okay (either through translator or granddaughter).  As of now, hold on any seizure meds.  Let me know if any further events.

## 2015-08-28 NOTE — Progress Notes (Signed)
Granddaughter made aware of results. She will hold seizure meds and call with any problems.

## 2015-08-28 NOTE — Procedures (Signed)
ELECTROENCEPHALOGRAM REPORT  Dates of Recording: 08/20/2015 to 08/22/2015  Patient's Name: Michele Rogers MRN: [id number] Date of Birth: 02-20-1935  Referring Provider: Dr. Lurena Joiner Tat  Procedure: 48-hour ambulatory EEG  History: This is a 80 year old woman who had a new onset seizure last May 2016. She was sitting on a stool fell off and her eyes rolled back, she was flexed then started shaking a few seconds. She had no recolection of this.  Medications: ASA, Hydrodiurel, Claritin, Cozaaar, Lopressor  Technical Summary: This is a 48-hour multichannel digital EEG recording measured by the international 10-20 system with electrodes applied with paste and impedances below 5000 ohms performed as portable with EKG monitoring.  The digital EEG was referentially recorded, reformatted, and digitally filtered in a variety of bipolar and referential montages for optimal display.    DESCRIPTION OF RECORDING: During maximal wakefulness, the background activity consisted of a poorly sustained 10 Hz posterior dominant rhythm that is poorly reactive to eye opening and eye closure.  There were no epileptiform discharges or focal slowing seen in wakefulness.  During the recording, the patient progresses through wakefulness, drowsiness, and Stage 2 sleep.  Again, there were no epileptiform discharges seen.  Events: There were no push button events.   There were no electrographic seizures seen.  EKG lead showed rare PVCs.  IMPRESSION: This 48-hour ambulatory EEG study is normal.    CLINICAL CORRELATION: A normal EEG does not exclude a clinical diagnosis of epilepsy.  Typical events were not captured. If further clinical questions remain, inpatient video EEG monitoring may be helpful.   Patrcia Dolly, M.D.

## 2015-08-30 ENCOUNTER — Other Ambulatory Visit: Payer: Self-pay | Admitting: Cardiovascular Disease

## 2015-09-26 ENCOUNTER — Ambulatory Visit (INDEPENDENT_AMBULATORY_CARE_PROVIDER_SITE_OTHER): Payer: Medicaid Other | Admitting: Cardiovascular Disease

## 2015-09-26 ENCOUNTER — Encounter: Payer: Self-pay | Admitting: Cardiovascular Disease

## 2015-09-26 VITALS — BP 120/70 | HR 72 | Ht 62.0 in | Wt 140.5 lb

## 2015-09-26 DIAGNOSIS — E785 Hyperlipidemia, unspecified: Secondary | ICD-10-CM | POA: Diagnosis not present

## 2015-09-26 DIAGNOSIS — R000 Tachycardia, unspecified: Secondary | ICD-10-CM

## 2015-09-26 DIAGNOSIS — I10 Essential (primary) hypertension: Secondary | ICD-10-CM

## 2015-09-26 DIAGNOSIS — I159 Secondary hypertension, unspecified: Secondary | ICD-10-CM | POA: Diagnosis not present

## 2015-09-26 NOTE — Patient Instructions (Addendum)
Medication Instructions:  Your physician recommends that you continue on your current medications as directed. Please refer to the Current Medication list given to you today.   Labwork: Your physician recommends that you return for a FASTING lipid and liver profile and BMET in six months. Nothing to eat or drink after midnight the evening before your labs.    Testing/Procedures: none  Follow-Up: Your physician wants you to follow-up in: six months. You will receive a reminder letter in the mail two months in advance. If you don't receive a letter, please call our office to schedule the follow-up appointment.   Any Other Special Instructions Will Be Listed Below (If Applicable).

## 2015-09-26 NOTE — Progress Notes (Signed)
Cardiology Office Note   Date:  09/26/2015   ID:  [name], DOB [birth year], MRN [id number]  PCP:  Wynona Dove, MD  Cardiologist:   Vesta Mixer, MD   Chief Complaint  Patient presents with  . other    6 month f/u c/o rapid heart beat at times has left chest pain mentioned she had a fall yrs ago and since she had fall she has still had some discomfort, arm numbness and leg numbness. Meds reviewed verbally with pt.    1. Hypertension  History of Present Illness:  Michele Rogers is a 80 yo female from [place] who is seen today with her granddaughter. They are saddened today due to the death of her Michele Rogers ( granddaughter's brother) He was found dead in his bed yesterday.  She's done very well on the Losartan. Initially she was having some difficulty in getting Medicaid to improve the Losartan but she developed a cough with the lisinopril and they ultimately have approved the losartan.  Her blood pressures been doing well. She's been avoiding extra salt.  May 03, 2013:  Michele Rogers has had some dizziness back in February. The episodes have resolved.  She has not been exercising - is about to start.   August 10, 2012:  Michele Rogers is doing well. We prescribed Hydrochlorothiazide and potassium chloride during her last visit. She took both of these for one month and then never had the prescriptions refilled. She complains of some mild leg swelling.  She's scheduled to leave for [place] in September and will stay through January. She's not having any cardiac complaints.  Feb. 23, 2015:  Michele Rogers is doing OK. Occasional ankle edema and mild headaches.   August 23, 2014:  March 05, 2015:  Michele Rogers is a 80 y.o. female who presents for follow-up of her hypertension. Was seen with [name]  Sept. 28 , 2016 Pt was seen today with Wilford Corner , Intrepreter.  Feeling well, able to do all of her usual activities without any dyspnea or fatigue .     Past  Medical History  Diagnosis Date  . Hypertension   . Hyperlipidemia   . Headache(784.0)   . Arthritis   . Chest pain     Past Surgical History  Procedure Laterality Date  . Abdominal hysterectomy      complete  . Cholecystectomy    . Vaginal delivery      5  . Humerus fracture surgery      left arm     Current Outpatient Prescriptions  Medication Sig Dispense Refill  . aspirin 81 MG tablet Take 81 mg by mouth daily.    Marland Kitchen atorvastatin (LIPITOR) 40 MG tablet TOME MEDIA TABLETA (1/2) POR VIA ORAL TODOS LOS DIAS 45 tablet 3  . atorvastatin (LIPITOR) 40 MG tablet TOME MEDIA TABLETA (1/2) POR VIA ORAL TODOS LOS DIAS 45 tablet 3  . hydrochlorothiazide (HYDRODIURIL) 25 MG tablet TAKE 1 TABLET (25 MG TOTAL) BY MOUTH DAILY. 90 tablet 3  . KLOR-CON 10 10 MEQ tablet TOME UNA TABLETA POR VIA ORAL TODOS LOS DIAS 90 tablet 3  . KLOR-CON 10 10 MEQ tablet TOME UNA TABLETA POR VIA ORAL TODOS LOS DIAS 90 tablet 3  . loratadine (CLARITIN) 10 MG tablet Take 1 tablet (10 mg total) by mouth daily. 30 tablet 11  . losartan (COZAAR) 100 MG tablet TAKE 1 TABLET (100 MG TOTAL) BY MOUTH DAILY. 90 tablet 3  . metoprolol (LOPRESSOR) 100 MG tablet TAKE 1/2  A TABLET BY MOUTH 2 TIMES DAILY. 90 tablet 4   No current facility-administered medications for this visit.    Allergies:   Lisinopril    Social History:  The patient  reports that she has never smoked. She has never used smokeless tobacco. She reports that she drinks alcohol. She reports that she does not use illicit drugs.   Family History:  The patient's family history includes Cancer in her sister.    ROS:  Please see the history of present illness.    Review of Systems: Constitutional:  denies fever, chills, diaphoresis, appetite change and fatigue.  HEENT: denies photophobia, eye pain, redness, hearing loss, ear pain, congestion, sore throat, rhinorrhea, sneezing, neck pain, neck stiffness and tinnitus.  Respiratory: denies SOB, DOE, cough,  chest tightness, and wheezing.  Cardiovascular: denies chest pain, palpitations and leg swelling.  Gastrointestinal: denies nausea, vomiting, abdominal pain, diarrhea, constipation, blood in stool.  Genitourinary: denies dysuria, urgency, frequency, hematuria, flank pain and difficulty urinating.  Musculoskeletal: denies  myalgias, back pain, joint swelling, arthralgias and gait problem.   Skin: denies pallor, rash and wound.  Neurological: denies dizziness, seizures, syncope, weakness, light-headedness, numbness and headaches.   Hematological: denies adenopathy, easy bruising, personal or family bleeding history.  Psychiatric/ Behavioral: denies suicidal ideation, mood changes, confusion, nervousness, sleep disturbance and agitation.       All other systems are reviewed and negative.    PHYSICAL EXAM: VS:  BP 120/70 mmHg  Pulse 72  Ht  (1.575 m)  Wt 63.73 kg (140 lb 8 oz)  BMI 25.69 kg/m2 , BMI Body mass index is 25.69 kg/(m^2). GEN: Well nourished, well developed, in no acute distress HEENT: normal Neck: no JVD, carotid bruits, or masses Cardiac: RRR; no murmurs, rubs, or gallops,no edema  Respiratory:  clear to auscultation bilaterally, normal work of breathing GI: soft, nontender, nondistended, + BS MS: no deformity or atrophy Skin: warm and dry,   scaley rash on arms  Neuro:  Strength and sensation are intact Psych: normal   EKG:  EKG is ordered today. Sept. 28, 2016:  NSR at 62.  No ST or T wave changes.    Recent Labs: 03/05/2015: ALT 15; BUN 20; Potassium 4.9; Sodium 143 08/02/2015: Creatinine, Ser 0.78    Lipid Panel    Component Value Date/Time   CHOL 170 03/05/2015 1212   CHOL 156 05/30/2013 1642   TRIG 276* 03/05/2015 1212   HDL 51 03/05/2015 1212   HDL 50.00 05/30/2013 1642   CHOLHDL 3.3 03/05/2015 1212   CHOLHDL 3 05/30/2013 1642   VLDL 47.6* 05/30/2013 1642   LDLCALC 64 03/05/2015 1212   LDLCALC 67 12/01/2011 1022   LDLDIRECT 81.1 05/30/2013  1642      Wt Readings from Last 3 Encounters:  09/26/15 63.73 kg (140 lb 8 oz)  07/23/15 63.05 kg (139 lb)  05/21/15 62.506 kg (137 lb 12.8 oz)      Other studies Reviewed: Additional studies/ records that were reviewed today include: . Review of the above records demonstrates:    ASSESSMENT AND PLAN:  1.  Essential hypertension: Andilyn is doing very well. It appears that she's taking 2 different kinds of potassium chloride. One of the forms of potassium is a very large pill that is difficult to swallow. She has a second prescription for potassium chloride that is smaller and easier to swallow. She would prefer to take the smaller tablet. She does not know which one. We'll check level today to make  sure that her potassium levels okay. She will come back with her granddaughter tomorrow and will bring her medications and we should be on tell which medication she was to continue in which when we should stop.  I'll see her again in 6 months for follow-up visit.   Current medicines are reviewed at length with the patient today.  The patient does not have concerns regarding medicines.  The following changes have been made:  no change   Disposition:   FU with me in 6 months     Signed, Lyndel Dancel, Deloris Ping, MD  09/26/2015 2:25 PM    [facility] [address] [place], [place], [place]  [address] Phone: [phone]; Fax: [phone]

## 2015-10-31 ENCOUNTER — Other Ambulatory Visit: Payer: Self-pay | Admitting: Cardiovascular Disease

## 2015-11-01 ENCOUNTER — Ambulatory Visit (INDEPENDENT_AMBULATORY_CARE_PROVIDER_SITE_OTHER): Payer: Medicaid Other | Admitting: Family Medicine

## 2015-11-01 ENCOUNTER — Encounter: Payer: Self-pay | Admitting: Family Medicine

## 2015-11-01 VITALS — BP 104/62 | HR 81 | Temp 99.0°F | Ht 62.0 in | Wt 139.5 lb

## 2015-11-01 DIAGNOSIS — M546 Pain in thoracic spine: Secondary | ICD-10-CM

## 2015-11-01 DIAGNOSIS — W19XXXA Unspecified fall, initial encounter: Secondary | ICD-10-CM

## 2015-11-01 DIAGNOSIS — M25572 Pain in left ankle and joints of left foot: Secondary | ICD-10-CM | POA: Diagnosis not present

## 2015-11-01 DIAGNOSIS — M25571 Pain in right ankle and joints of right foot: Secondary | ICD-10-CM

## 2015-11-01 MED ORDER — TRAMADOL HCL 50 MG PO TABS: 50.0000 mg | ORAL_TABLET | Freq: Three times a day (TID) | ORAL | Status: DC | PRN

## 2015-11-01 NOTE — Progress Notes (Signed)
Pre visit review using our clinic review tool, if applicable. No additional management support is needed unless otherwise documented below in the visit note. 

## 2015-11-01 NOTE — Patient Instructions (Signed)
Vamos a llamar con sus resultados de rayos x.  Tome el tramadol segn sea necesario.  Llame con preocupaciones.  Dr. Adriana Simasook

## 2015-11-01 NOTE — Progress Notes (Signed)
Subjective:  Patient ID: Michele Rogers, female    DOB: 09/09/1935  Age: 80 y.o. MRN: [id number]  CC: Fall, ankle pain, back pain  HPI:  80 year old female presents to clinic today with the above complaints. Phone interpreter used for this visit: Michele CleverlyJackie Rogers.  1) Fall  Patient reports that she fell last Thursday.  She states that she missed her step and injured her ankles particularly the right.  She also states that she has had some back pain since that time.  Ankle pain is located laterally. Back pain is located in the lower thoracic spine in the midline.  Pain is mild to moderate in severity.  She's been taking some over-the-counter pain medications with some relief.  She states that her ankle pain is slightly improved.  No exacerbating factors.  No other reported symptoms today.  Social Hx   Social History   Social History  . Marital Status: Widowed    Spouse Name: N/A  . Number of Children: N/A  . Years of Education: N/A   Social History Main Topics  . Smoking status: Never Smoker   . Smokeless tobacco: Never Used  . Alcohol Use: 0.0 oz/week    0 Standard drinks or equivalent per week     Comment: one drink every couple months  . Drug Use: No  . Sexual Activity: Not Asked   Other Topics Concern  . None   Social History Narrative   [place] in [place], from [place]. Lives with daughter, granddaughter. No pets.    Review of Systems  Constitutional: Negative.   Musculoskeletal: Positive for back pain and joint swelling.       Ankle pain, bilaterally.    Objective:  BP 104/62 mmHg  Pulse 81  Temp(Src) 99 F (37.2 C) (Oral)  Ht 5\' 2"  (1.575 m)  Wt 139 lb 8 oz (63.277 kg)  BMI 25.51 kg/m2  SpO2 97%  BP/Weight 11/01/2015 09/26/2015 07/23/2015  Systolic BP 104 120 130  Diastolic BP 62 70 80  Wt. (Lbs) 139.5 140.5 139  BMI 25.51 25.69 25.42   Physical Exam  Constitutional: She appears well-developed. No distress.  HENT:  Head:  Normocephalic and atraumatic.  Cardiovascular: Normal rate and regular rhythm.   Pulmonary/Chest: Effort normal. No respiratory distress. She has no wheezes. She has no rales.  Musculoskeletal:  R ankle - tender to palpation (at the lateral malleous). Full ROM. L ankle - mild swelling. Nontender. Back - lower thoracic spine mildly tender to palpation.  Neurological: She is alert.  Psychiatric: She has a normal mood and affect.    Lab Results  Component Value Date   WBC 6.9 05/30/2013   HGB 12.8 05/30/2013   HCT 37.3 05/30/2013   PLT 228.0 05/30/2013   GLUCOSE 122* 03/05/2015   CHOL 170 03/05/2015   TRIG 276* 03/05/2015   HDL 51 03/05/2015   LDLDIRECT 81.1 05/30/2013   LDLCALC 64 03/05/2015   ALT 15 03/05/2015   AST 22 03/05/2015   NA 143 03/05/2015   K 4.9 03/05/2015   CL 101 03/05/2015   CREATININE 0.78 08/02/2015   BUN 20 03/05/2015   CO2 24 03/05/2015   TSH 1.12 05/30/2013   INR 0.8 04/20/2012   MICROALBUR 0.1 05/30/2013    Assessment & Plan:   Problem List Items Addressed This Visit    Fall - Primary    Given age and physical exam obtaining radiographs of the ankles as well as the back. Tramadol given for pain.  Relevant Orders   DG Ankle Complete Right   DG Ankle Complete Left   DG Thoracic Spine 2 View      Meds ordered this encounter  Medications  . traMADol (ULTRAM) 50 MG tablet    Sig: Take 1 tablet (50 mg total) by mouth every 8 (eight) hours as needed.    Dispense:  30 tablet    Refill:  0    Follow-up: PRN  Everlene Other, DO

## 2015-11-01 NOTE — Assessment & Plan Note (Signed)
Given age and physical exam obtaining radiographs of the ankles as well as the back. Tramadol given for pain.

## 2015-11-02 ENCOUNTER — Ambulatory Visit (INDEPENDENT_AMBULATORY_CARE_PROVIDER_SITE_OTHER)
Admission: RE | Admit: 2015-11-02 | Discharge: 2015-11-02 | Disposition: A | Discharge status: Home / Self Care | Payer: Medicaid Other | Source: Ambulatory Visit | Attending: Family Medicine | Admitting: Family Medicine

## 2015-11-02 DIAGNOSIS — M25571 Pain in right ankle and joints of right foot: Secondary | ICD-10-CM | POA: Diagnosis not present

## 2015-11-02 DIAGNOSIS — M546 Pain in thoracic spine: Secondary | ICD-10-CM | POA: Diagnosis not present

## 2015-11-02 DIAGNOSIS — W19XXXA Unspecified fall, initial encounter: Secondary | ICD-10-CM

## 2015-11-02 DIAGNOSIS — M25572 Pain in left ankle and joints of left foot: Secondary | ICD-10-CM | POA: Diagnosis not present

## 2015-11-13 ENCOUNTER — Telehealth: Payer: Self-pay | Admitting: *Deleted

## 2015-11-13 NOTE — Telephone Encounter (Signed)
Please find out. Thanks

## 2015-11-13 NOTE — Telephone Encounter (Signed)
[place] and pt is in need of refills to last her until she comes back. Mid Jan.    *STAT* If patient is at the pharmacy, call can be transferred to refill team.   1. Which medications need to be refilled? (please list name of each medication and dose if known)   2. Which pharmacy/location (including street and city if local pharmacy) is medication to be sent to?   3. Do they need a 30 day or 90 day supply?   Pt grandaughter will call back. She did not know what pt needed.

## 2015-11-14 MED ORDER — ATORVASTATIN CALCIUM 40 MG PO TABS: ORAL_TABLET | ORAL | Status: DC

## 2015-11-14 MED ORDER — METOPROLOL TARTRATE 100 MG PO TABS: ORAL_TABLET | ORAL | Status: DC

## 2015-11-14 MED ORDER — POTASSIUM CHLORIDE ER 10 MEQ PO TBCR: EXTENDED_RELEASE_TABLET | ORAL | Status: DC

## 2015-11-14 NOTE — Telephone Encounter (Signed)
°*  STAT* If patient is at the pharmacy, call can be transferred to refill team.   1. Which medications need to be refilled? (please list name of each medication and dose if known)  Metoprolol Hydroxyzine  Atorvastatin  Potassium Chloride  2. Which pharmacy/location (including street and city if local pharmacy) is medication to be sent to? [facility] [address]   3. Do they need a 30 day or 90 day supply? 90 day

## 2015-11-14 NOTE — Telephone Encounter (Signed)
Refill sent for 90 day supply metoprolol, atorvastatin and potassium. The patient will need to contact PCP for a refill on Hydroxyzine.

## 2015-12-04 ENCOUNTER — Other Ambulatory Visit: Payer: Self-pay | Admitting: Cardiovascular Disease

## 2016-01-20 ENCOUNTER — Other Ambulatory Visit: Payer: Self-pay | Admitting: Cardiovascular Disease

## 2016-02-28 ENCOUNTER — Other Ambulatory Visit: Payer: Self-pay | Admitting: Cardiovascular Disease

## 2016-03-27 ENCOUNTER — Ambulatory Visit: Payer: Self-pay | Admitting: Cardiovascular Disease

## 2016-04-15 ENCOUNTER — Ambulatory Visit (INDEPENDENT_AMBULATORY_CARE_PROVIDER_SITE_OTHER): Payer: Medicaid Other | Admitting: Cardiovascular Disease

## 2016-04-15 ENCOUNTER — Encounter: Payer: Self-pay | Admitting: Cardiovascular Disease

## 2016-04-15 VITALS — BP 130/78 | HR 77 | Ht 62.0 in | Wt 138.6 lb

## 2016-04-15 DIAGNOSIS — E785 Hyperlipidemia, unspecified: Secondary | ICD-10-CM | POA: Diagnosis not present

## 2016-04-15 DIAGNOSIS — I10 Essential (primary) hypertension: Secondary | ICD-10-CM

## 2016-04-15 LAB — COMPREHENSIVE METABOLIC PANEL
ALK PHOS: 95 U/L (ref 33–130)
ALT: 14 U/L (ref 6–29)
AST: 19 U/L (ref 10–35)
Albumin: 4.1 g/dL (ref 3.6–5.1)
BUN: 18 mg/dL (ref 7–25)
CALCIUM: 9.1 mg/dL (ref 8.6–10.4)
CHLORIDE: 102 mmol/L (ref 98–110)
CO2: 31 mmol/L (ref 20–31)
Creat: 0.8 mg/dL (ref 0.60–0.93)
GLUCOSE: 96 mg/dL (ref 65–99)
POTASSIUM: 4.7 mmol/L (ref 3.5–5.3)
Sodium: 142 mmol/L (ref 135–146)
TOTAL PROTEIN: 7.2 g/dL (ref 6.1–8.1)
Total Bilirubin: 1.4 mg/dL — ABNORMAL HIGH (ref 0.2–1.2)

## 2016-04-15 LAB — LIPID PANEL
CHOLESTEROL: 159 mg/dL (ref 125–200)
HDL: 54 mg/dL (ref >=46)
LDL CALC: 60 mg/dL (ref <130)
TRIGLYCERIDES: 224 mg/dL — AB (ref <150)
Total CHOL/HDL Ratio: 2.9 Ratio (ref <=5.0)
VLDL: 45 mg/dL — AB (ref <30)

## 2016-04-15 NOTE — Progress Notes (Signed)
Cardiology Office Note   Date:  04/15/2016   ID:  IZUMI MIXON, DOB 1935-05-04, MRN [id number]  PCP:  Wynona Dove, MD  Cardiologist:   Vesta Mixer, MD   Chief Complaint  Patient presents with  . Follow-up    htn   1. Hypertension  History of Present Illness:  [name] is a 81 yo female from [place] who is seen today with her granddaughter. They are saddened today due to the death of her [name] ( granddaughter's brother) He was found dead in his bed yesterday.  She's done very well on the Losartan. Initially she was having some difficulty in getting Medicaid to improve the Losartan but she developed a cough with the lisinopril and they ultimately have approved the losartan.  Her blood pressures been doing well. She's been avoiding extra salt.  May 03, 2013:  Bryauna has had some dizziness back in February. The episodes have resolved.  She has not been exercising - is about to start.   August 10, 2012:  Rachna is doing well. We prescribed Hydrochlorothiazide and potassium chloride during her last visit. She took both of these for one month and then never had the prescriptions refilled. She complains of some mild leg swelling.  She's scheduled to leave for [place] in September and will stay through January. She's not having any cardiac complaints.  Feb. 23, 2015:  Haliegh is doing OK. Occasional ankle edema and mild headaches.   August 23, 2014:  March 05, 2015:  EVERLINA GOTTS is a 81 y.o. female who presents for follow-up of her hypertension. Was seen with [name]  Sept. 28 , 2016 Pt was seen today with Wilford Corner , Intrepreter.  Feeling well, able to do all of her usual activities without any dyspnea or fatigue .   April 15, 2016:    Seen with [name]    Intrepreter.  Doing well,  Feeling well No CP  Not exercising any , does not enjoy it .   Walks on occasion    Past Medical History  Diagnosis Date  .  Hypertension   . Hyperlipidemia   . Headache(784.0)   . Arthritis   . Chest pain     Past Surgical History  Procedure Laterality Date  . Abdominal hysterectomy      complete  . Cholecystectomy    . Vaginal delivery      5  . Humerus fracture surgery      left arm     Current Outpatient Prescriptions  Medication Sig Dispense Refill  . aspirin 81 MG tablet Take 81 mg by mouth daily.    Marland Kitchen atorvastatin (LIPITOR) 40 MG tablet TOME MEDIA TABLETA (1/2) POR VIA ORAL TODOS LOS DIAS 45 tablet 3  . hydrochlorothiazide (HYDRODIURIL) 25 MG tablet TAKE 1 TABLET (25 MG TOTAL) BY MOUTH DAILY. 90 tablet 3  . KLOR-CON 10 10 MEQ tablet TOME UNA TABLETA POR VIA ORAL TODOS LOS DIAS 90 tablet 0  . loratadine (CLARITIN) 10 MG tablet Take 1 tablet (10 mg total) by mouth daily. 30 tablet 11  . losartan (COZAAR) 100 MG tablet TAKE 1 TABLET (100 MG TOTAL) BY MOUTH DAILY. 90 tablet 3  . metoprolol (LOPRESSOR) 100 MG tablet TAKE 1/2 A TABLET BY MOUTH 2 TIMES DAILY. 90 tablet 2  . traMADol (ULTRAM) 50 MG tablet Take 1 tablet (50 mg total) by mouth every 8 (eight) hours as needed. 30 tablet 0   No current facility-administered medications for  this visit.    Allergies:   Lisinopril    Social History:  The patient  reports that she has never smoked. She has never used smokeless tobacco. She reports that she drinks alcohol. She reports that she does not use illicit drugs.   Family History:  The patient's family history includes Cancer in her sister.    ROS:  Please see the history of present illness.    Review of Systems: Constitutional:  denies fever, chills, diaphoresis, appetite change and fatigue.  HEENT: denies photophobia, eye pain, redness, hearing loss, ear pain, congestion, sore throat, rhinorrhea, sneezing, neck pain, neck stiffness and tinnitus.  Respiratory: denies SOB, DOE, cough, chest tightness, and wheezing.  Cardiovascular: denies chest pain, palpitations and leg swelling.    Gastrointestinal: denies nausea, vomiting, abdominal pain, diarrhea, constipation, blood in stool.  Genitourinary: denies dysuria, urgency, frequency, hematuria, flank pain and difficulty urinating.  Musculoskeletal: denies  myalgias, back pain, joint swelling, arthralgias and gait problem.   Skin: denies pallor, rash and wound.  Neurological: denies dizziness, seizures, syncope, weakness, light-headedness, numbness and headaches.   Hematological: denies adenopathy, easy bruising, personal or family bleeding history.  Psychiatric/ Behavioral: denies suicidal ideation, mood changes, confusion, nervousness, sleep disturbance and agitation.       All other systems are reviewed and negative.    PHYSICAL EXAM: VS:  BP 130/78 mmHg  Pulse 77  Ht  (1.575 m)  Wt 138 lb 9.6 oz (62.869 kg)  BMI 25.34 kg/m2  SpO2 97% , BMI Body mass index is 25.34 kg/(m^2). GEN: Well nourished, well developed, in no acute distress HEENT: normal Neck: no JVD, carotid bruits, or masses Cardiac: RRR; no murmurs, rubs, or gallops,no edema  Respiratory:  clear to auscultation bilaterally, normal work of breathing GI: soft, nontender, nondistended, + BS MS: no deformity or atrophy Skin: warm and dry,   scaley rash on arms  Neuro:  Strength and sensation are intact Psych: normal   EKG:  EKG is ordered today. Sept. 28, 2016:  NSR at 62.  No ST or T wave changes.    Recent Labs: 08/02/2015: Creatinine, Ser 0.78    Lipid Panel    Component Value Date/Time   CHOL 170 03/05/2015 1212   CHOL 156 05/30/2013 1642   TRIG 276* 03/05/2015 1212   HDL 51 03/05/2015 1212   HDL 50.00 05/30/2013 1642   CHOLHDL 3.3 03/05/2015 1212   CHOLHDL 3 05/30/2013 1642   VLDL 47.6* 05/30/2013 1642   LDLCALC 64 03/05/2015 1212   LDLCALC 67 12/01/2011 1022   LDLDIRECT 81.1 05/30/2013 1642      Wt Readings from Last 3 Encounters:  04/15/16 138 lb 9.6 oz (62.869 kg)  11/01/15 139 lb 8 oz (63.277 kg)  09/26/15 140 lb 8  oz (63.73 kg)      Other studies Reviewed: Additional studies/ records that were reviewed today include: . Review of the above records demonstrates:    ASSESSMENT AND PLAN:  1.  Essential hypertension: Kearie is doing very well.  Continue current meds  Will check BMP today  2. Hyperlipidemia:   Will check lipids and liver enz today   I'll see her again in 6 months for follow-up visit.   Current medicines are reviewed at length with the patient today.  The patient does not have concerns regarding medicines.  The following changes have been made:  no change   Disposition:   FU with me in 6 months     Signed, Nahser, Loistine Chance  J, MD  04/15/2016 11:18 AM    [facility] [address], [place], [place]  [address] Phone: [phone]; Fax: [phone]

## 2016-04-15 NOTE — Patient Instructions (Signed)

## 2016-08-27 ENCOUNTER — Telehealth: Payer: Self-pay

## 2016-08-27 NOTE — Telephone Encounter (Signed)
Prior auth for Losartan 100mg  initiated through [employer]. It needs to go to pharmacy review. 24 turnaround for approval.

## 2016-08-29 ENCOUNTER — Telehealth: Payer: Self-pay

## 2016-08-29 NOTE — Telephone Encounter (Signed)
Prior auth obtained for Losartan 100mg  through [employer]. [id number] and is good for 1 year.

## 2016-09-18 ENCOUNTER — Ambulatory Visit (INDEPENDENT_AMBULATORY_CARE_PROVIDER_SITE_OTHER): Payer: Medicaid Other | Admitting: Family Medicine

## 2016-09-18 ENCOUNTER — Encounter: Payer: Self-pay | Admitting: Family Medicine

## 2016-09-18 DIAGNOSIS — I10 Essential (primary) hypertension: Secondary | ICD-10-CM

## 2016-09-18 DIAGNOSIS — M25511 Pain in right shoulder: Secondary | ICD-10-CM | POA: Insufficient documentation

## 2016-09-18 DIAGNOSIS — E785 Hyperlipidemia, unspecified: Secondary | ICD-10-CM

## 2016-09-18 NOTE — Assessment & Plan Note (Signed)
Well-controlled.  Continue Lipitor. 

## 2016-09-18 NOTE — Assessment & Plan Note (Signed)
New problem. Likely has a component of rotator cuff pathology. Patient not interested in treatment at this time. She will continue Tylenol as needed.

## 2016-09-18 NOTE — Patient Instructions (Signed)
Continue her current meds.  Follow up in 6 months  Take care  Dr. Adriana Simasook

## 2016-09-18 NOTE — Progress Notes (Signed)
Subjective:  Patient ID: Michele Rogers, female    DOB: 09/30/1935  Age: 81 y.o. MRN: [id number]  CC: Follow up, Shoulder pain  HPI:  81 year old female with history of hypertension, hyperlipidemia presents for follow-up. She also has complaints of right shoulder pain. Patient is accompanied by her granddaughter.  HTN  Well controlled on HCTZ, Losartan, Metoprolol.  HLD  Well controlled on Lipitor.  Right shoulder pain  Started after a fall in [place] at the end of July.  Patient fell out of bed and landed on her right shoulder.  She was evaluated at a local emergency department in [place] and was given a brace.  X-rays were negative.  Patient continues to have intermittent pain.  Pain appears to be worse with range of motion.  She takes Tylenol improvement.  No other associated symptoms.  No other complaints at this time.  Social Hx   Social History   Social History  . Marital status: Widowed    Spouse name: N/A  . Number of children: N/A  . Years of education: N/A   Social History Main Topics  . Smoking status: Never Smoker  . Smokeless tobacco: Never Used  . Alcohol use 0.0 oz/week     Comment: one drink every couple months  . Drug use: No  . Sexual activity: Not Asked   Other Topics Concern  . None   Social History Narrative   [place]ives in [place], from [place]. Lives with daughter, granddaughter. No pets.     Review of Systems  Constitutional: Negative.   Musculoskeletal:       Right shoulder pain.   Objective:  BP 118/68 (BP Location: Left Arm, Patient Position: Sitting, Cuff Size: Normal)   Pulse 86   Temp (!) 86 F (30 C) (Oral)   Wt 140 lb 4 oz (63.6 kg)   SpO2 95%   BMI 25.65 kg/m   BP/Weight 09/18/2016 04/15/2016 11/01/2015  Systolic BP 118 130 104  Diastolic BP 68 78 62  Wt. (Lbs) 140.25 138.6 139.5  BMI 25.65 25.34 25.51   Physical Exam  Constitutional: She is oriented to person, place, and time. She appears well-developed.  No distress.  Pulmonary/Chest: Effort normal.  Musculoskeletal:  Shoulder: Right shoulder Inspection reveals no abnormalities, atrophy or asymmetry. Palpation is normal with no tenderness over AC joint or bicipital groove. ROM mildly decreased in flexion. Rotator cuff strength normal excluding infraspinatus/teres minor (4/5).   Neurological: She is alert and oriented to person, place, and time.  Psychiatric: She has a normal mood and affect.  Vitals reviewed.  Lab Results  Component Value Date   WBC 6.9 05/30/2013   HGB 12.8 05/30/2013   HCT 37.3 05/30/2013   PLT 228.0 05/30/2013   GLUCOSE 96 04/15/2016   CHOL 159 04/15/2016   TRIG 224 (H) 04/15/2016   HDL 54 04/15/2016   LDLDIRECT 81.1 05/30/2013   LDLCALC 60 04/15/2016   ALT 14 04/15/2016   AST 19 04/15/2016   NA 142 04/15/2016   K 4.7 04/15/2016   CL 102 04/15/2016   CREATININE 0.80 04/15/2016   BUN 18 04/15/2016   CO2 31 04/15/2016   TSH 1.12 05/30/2013   INR 0.8 04/20/2012   MICROALBUR 0.1 05/30/2013   Assessment & Plan:   Problem List Items Addressed This Visit    Hyperlipidemia    Well controlled. Continue Lipitor.      Hypertension    Stable. Continue HCTZ, losartan, metoprolol.      Right shoulder pain  New problem. Likely has a component of rotator cuff pathology. Patient not interested in treatment at this time. She will continue Tylenol as needed.       Other Visit Diagnoses   None.    Follow-up: 6 months.  Everlene Other DO [facility]

## 2016-09-18 NOTE — Assessment & Plan Note (Signed)
Stable. Continue HCTZ, losartan, metoprolol.

## 2016-09-19 ENCOUNTER — Telehealth: Payer: Self-pay | Admitting: Internal Medicine

## 2016-09-19 NOTE — Telephone Encounter (Signed)
Pt received flu shot on 09/18/16. Thank you!

## 2016-09-19 NOTE — Telephone Encounter (Signed)
Updated chart, thanks.

## 2016-11-23 ENCOUNTER — Other Ambulatory Visit: Payer: Self-pay | Admitting: Cardiovascular Disease

## 2016-11-28 ENCOUNTER — Other Ambulatory Visit: Payer: Self-pay | Admitting: Cardiovascular Disease

## 2017-01-23 ENCOUNTER — Other Ambulatory Visit: Payer: Self-pay | Admitting: Cardiovascular Disease

## 2017-01-26 NOTE — Telephone Encounter (Signed)
Refill Request.  

## 2017-02-23 ENCOUNTER — Other Ambulatory Visit: Payer: Self-pay | Admitting: Cardiovascular Disease

## 2017-03-18 ENCOUNTER — Ambulatory Visit: Payer: Self-pay | Admitting: Family Medicine

## 2017-03-31 ENCOUNTER — Ambulatory Visit (INDEPENDENT_AMBULATORY_CARE_PROVIDER_SITE_OTHER): Payer: Medicaid Other | Admitting: Family Medicine

## 2017-03-31 VITALS — BP 128/78 | HR 86 | Temp 97.9°F | Wt 139.4 lb

## 2017-03-31 DIAGNOSIS — R1032 Left lower quadrant pain: Secondary | ICD-10-CM

## 2017-03-31 DIAGNOSIS — K219 Gastro-esophageal reflux disease without esophagitis: Secondary | ICD-10-CM | POA: Diagnosis not present

## 2017-03-31 DIAGNOSIS — E785 Hyperlipidemia, unspecified: Secondary | ICD-10-CM

## 2017-03-31 DIAGNOSIS — I10 Essential (primary) hypertension: Secondary | ICD-10-CM

## 2017-03-31 DIAGNOSIS — R739 Hyperglycemia, unspecified: Secondary | ICD-10-CM

## 2017-03-31 LAB — COMPREHENSIVE METABOLIC PANEL
ALBUMIN: 4.1 g/dL (ref 3.5–5.2)
ALK PHOS: 85 U/L (ref 39–117)
ALT: 15 U/L (ref 0–35)
AST: 22 U/L (ref 0–37)
BUN: 17 mg/dL (ref 6–23)
CO2: 31 mEq/L (ref 19–32)
CREATININE: 0.79 mg/dL (ref 0.40–1.20)
Calcium: 9.8 mg/dL (ref 8.4–10.5)
Chloride: 103 mEq/L (ref 96–112)
GFR: 74.54 mL/min (ref >60.00)
Glucose, Bld: 149 mg/dL — ABNORMAL HIGH (ref 70–99)
Potassium: 4.6 mEq/L (ref 3.5–5.1)
SODIUM: 140 meq/L (ref 135–145)
TOTAL PROTEIN: 7.9 g/dL (ref 6.0–8.3)
Total Bilirubin: 1.3 mg/dL — ABNORMAL HIGH (ref 0.2–1.2)

## 2017-03-31 LAB — CBC
HCT: 39 % (ref 36.0–46.0)
HEMOGLOBIN: 13.1 g/dL (ref 12.0–15.0)
MCHC: 33.5 g/dL (ref 30.0–36.0)
MCV: 97.7 fl (ref 78.0–100.0)
Platelets: 261 10*3/uL (ref 150.0–400.0)
RBC: 3.99 Mil/uL (ref 3.87–5.11)
RDW: 13.5 % (ref 11.5–15.5)
WBC: 6.5 10*3/uL (ref 4.0–10.5)

## 2017-03-31 LAB — LIPID PANEL
Cholesterol: 154 mg/dL (ref 0–200)
HDL: 52.4 mg/dL (ref >39.00)
LDL Cholesterol: 67 mg/dL (ref 0–99)
NONHDL: 101.38
Total CHOL/HDL Ratio: 3
Triglycerides: 171 mg/dL — ABNORMAL HIGH (ref 0.0–149.0)
VLDL: 34.2 mg/dL (ref 0.0–40.0)

## 2017-03-31 LAB — HEMOGLOBIN A1C: HEMOGLOBIN A1C: 6.2 % (ref 4.6–6.5)

## 2017-03-31 MED ORDER — PANTOPRAZOLE SODIUM 40 MG PO TBEC: 40.0000 mg | DELAYED_RELEASE_TABLET | Freq: Every day | ORAL | 3 refills | Status: DC

## 2017-03-31 MED ORDER — DOCUSATE SODIUM 100 MG PO CAPS: 100.0000 mg | ORAL_CAPSULE | Freq: Two times a day (BID) | ORAL | 0 refills | Status: AC

## 2017-03-31 NOTE — Assessment & Plan Note (Signed)
At goal. Continue HCTZ, losartan, Toprol. Labs today.

## 2017-03-31 NOTE — Assessment & Plan Note (Signed)
New problem. Starting on protonix. Early satiety is worrisome.  Labs today. Follow up in 1 month; if no improvement will need to see GI.

## 2017-03-31 NOTE — Assessment & Plan Note (Signed)
New problem. Labs today. Treating constipation with Colace.

## 2017-03-31 NOTE — Progress Notes (Signed)
Subjective:  Patient ID: Michele Rogers, female    DOB: 30-Jul-1935  Age: 82 y.o. MRN: [id number]  CC: Follow up  HPI:  82 year old female with hypertension, hyperlipidemia presents for follow-up. Issues are below.  Hypertension  At goal on HCTZ, losartan, metoprolol.  Hyperlipidemia  Has been well controlled on Lipitor.  Heartburn, LLQ pain  Patient reports that she's been experiencing significant heartburn and associated nausea.  Particularly with certain foods - candy, milk, etc.  Patient also reports early satiety. She states that she feels full very easily and this is caused her to eat smaller meals.  No weight loss.  She's also had ongoing left or quadrant pain. She's unsure of how long this is been going on. No hematochezia or melena. She does report significant constipation. She often has to strain to produce a bowel movement. She does state that she produces a hard bowel movement daily.  No medications or interventions tried. No other associated symptoms. No other complaints this time.   Social Hx   Social History   Social History  . Marital status: Widowed    Spouse name: N/A  . Number of children: N/A  . Years of education: N/A   Social History Main Topics  . Smoking status: Never Smoker  . Smokeless tobacco: Never Used  . Alcohol use 0.0 oz/week     Comment: one drink every couple months  . Drug use: No  . Sexual activity: Not on file   Other Topics Concern  . Not on file   Social History Narrative   Lives in [place], from [place]. Lives with daughter, granddaughter. No pets.     Review of Systems  Constitutional: Negative for unexpected weight change.  Gastrointestinal: Positive for abdominal pain, constipation and nausea.  Musculoskeletal: Positive for back pain.   Objective:  BP 128/78   Pulse 86   Temp 97.9 F (36.6 C)   Wt 139 lb 6.4 oz (63.2 kg)   SpO2 98%   BMI 25.50 kg/m   BP/Weight 03/31/2017 09/18/2016 5/78/4696  Systolic  BP 295 284 132  Diastolic BP 78 68 78  Wt. (Lbs) 139.4 140.25 138.6  BMI 25.5 25.65 25.34   Physical Exam  Constitutional: She appears well-developed. No distress.  Cardiovascular: Normal rate and regular rhythm.   Pulmonary/Chest: Effort normal and breath sounds normal.  Abdominal: Soft.  Mildly tender to palpation in the left lower quadrant.  Neurological: She is alert.  Psychiatric: She has a normal mood and affect.  Vitals reviewed.   Lab Results  Component Value Date   WBC 6.9 05/30/2013   HGB 12.8 05/30/2013   HCT 37.3 05/30/2013   PLT 228.0 05/30/2013   GLUCOSE 96 04/15/2016   CHOL 159 04/15/2016   TRIG 224 (H) 04/15/2016   HDL 54 04/15/2016   LDLDIRECT 81.1 05/30/2013   LDLCALC 60 04/15/2016   ALT 14 04/15/2016   AST 19 04/15/2016   NA 142 04/15/2016   K 4.7 04/15/2016   CL 102 04/15/2016   CREATININE 0.80 04/15/2016   BUN 18 04/15/2016   CO2 31 04/15/2016   TSH 1.12 05/30/2013   INR 0.8 04/20/2012   MICROALBUR 0.1 05/30/2013    Assessment & Plan:   Problem List Items Addressed This Visit    GERD (gastroesophageal reflux disease)    New problem. Starting on protonix. Early satiety is worrisome.  Labs today. Follow up in 1 month; if no improvement will need to see GI.  Relevant Medications   docusate sodium (COLACE) 100 MG capsule   pantoprazole (PROTONIX) 40 MG tablet   Hyperlipidemia - Primary    Stable.      Relevant Orders   Lipid panel   Hypertension    At goal. Continue HCTZ, losartan, Toprol. Labs today.      Relevant Orders   Comp Met (CMET)   LLQ pain    New problem. Labs today. Treating constipation with Colace.      Relevant Orders   CBC    Other Visit Diagnoses    Blood glucose elevated       Relevant Orders   Hemoglobin A1c     Meds ordered this encounter  Medications  . docusate sodium (COLACE) 100 MG capsule    Sig: Take 1 capsule (100 mg total) by mouth 2 (two) times daily.    Dispense:  60 capsule     Refill:  0  . pantoprazole (PROTONIX) 40 MG tablet    Sig: Take 1 tablet (40 mg total) by mouth daily.    Dispense:  30 tablet    Refill:  3   Follow-up: 1 month  [place]

## 2017-03-31 NOTE — Patient Instructions (Signed)
[place] colace para sus deposiciones.  Protonix diariamente para la [employer] officer.  Seguimiento en 1 mes.

## 2017-03-31 NOTE — Assessment & Plan Note (Signed)
Stable

## 2017-04-03 ENCOUNTER — Encounter: Payer: Self-pay | Admitting: Cardiovascular Disease

## 2017-04-22 ENCOUNTER — Ambulatory Visit (INDEPENDENT_AMBULATORY_CARE_PROVIDER_SITE_OTHER): Payer: Medicaid Other | Admitting: Cardiovascular Disease

## 2017-04-22 ENCOUNTER — Encounter: Payer: Self-pay | Admitting: Cardiovascular Disease

## 2017-04-22 VITALS — BP 152/72 | HR 68 | Ht 62.0 in | Wt 142.4 lb

## 2017-04-22 DIAGNOSIS — E782 Mixed hyperlipidemia: Secondary | ICD-10-CM | POA: Diagnosis not present

## 2017-04-22 DIAGNOSIS — R000 Tachycardia, unspecified: Secondary | ICD-10-CM | POA: Diagnosis not present

## 2017-04-22 DIAGNOSIS — I10 Essential (primary) hypertension: Secondary | ICD-10-CM

## 2017-04-22 MED ORDER — METOPROLOL TARTRATE 100 MG PO TABS: 100.0000 mg | ORAL_TABLET | Freq: Two times a day (BID) | ORAL | 3 refills | Status: DC

## 2017-04-22 NOTE — Patient Instructions (Addendum)
Medication Instructions:  INCREASE Metoprolol (Lopressor) to 100 mg twice daily   Labwork: TODAY - CBC, CMET, TSH, Cholesterol   Testing/Procedures: Your physician has recommended that you wear an event monitor. Event monitors are medical devices that record the heart's electrical activity. Doctors most often [place] these monitors to diagnose arrhythmias. Arrhythmias are problems with the speed or rhythm of the heartbeat. The monitor is a small, portable device. You can wear one while you do your normal daily activities. This is usually used to diagnose what is causing palpitations/syncope (passing out).    Follow-Up: Your physician recommends that you schedule a follow-up appointment in: 3 months with Dr. Elease Hashimoto   If you need a refill on your cardiac medications before your next appointment, please call your pharmacy.   Thank you for choosing [facility]! Eligha Bridegroom, RN [phone]

## 2017-04-22 NOTE — Progress Notes (Signed)
Cardiology Office Note   Date:  04/22/2017   ID:  Michele Rogers, DOB 06-May-1935, MRN [id number]  PCP:  Tommie Sams, DO  Cardiologist:   Kristeen Miss, MD   Chief Complaint  Patient presents with  . Palpitations   1. Hypertension  History of Present Illness:  Michele Rogers is a 82 yo female from [place] who is seen today with her granddaughter. They are saddened today due to the death of her [name] ( granddaughter's brother) He was found dead in his bed yesterday.  She's done very well on the Losartan. Initially she was having some difficulty in getting Medicaid to improve the Losartan but she developed a cough with the lisinopril and they ultimately have approved the losartan.  Her blood pressures been doing well. She's been avoiding extra salt.  May 03, 2013:  Michele Rogers has had some dizziness back in February. The episodes have resolved.  She has not been exercising - is about to start.   August 10, 2012:  Michele Rogers is doing well. We prescribed Hydrochlorothiazide and potassium chloride during her last visit. She took both of these for one month and then never had the prescriptions refilled. She complains of some mild leg swelling.  She's scheduled to leave for [place] in September and will stay through January. She's not having any cardiac complaints.  Feb. 23, 2015:  Michele Rogers is doing OK. Occasional ankle edema and mild headaches.   August 23, 2014:  March 05, 2015:  Michele Rogers is a 82 y.o. female who presents for follow-up of her hypertension. Was seen with Michele Rogers  Sept. 28 , 2016 Pt was seen today with Michele Rogers , Intrepreter.  Feeling well, able to do all of her usual activities without any dyspnea or fatigue .   April 15, 2016:    Seen with Michele Rogers    Intrepreter.  Doing well,  Feeling well No CP  Not exercising any , does not enjoy it .   Walks on occasion   April 25. 2018  Michele Rogers is seen - using the Spanish intrepreter  video - stratus. Michele Rogers # [phone] and Michele Rogers [phone]) )  Having palpitations - fast HR  Last for 30 minutes or so  No syncope ,  No CP , no dyspnea Is leaving for [place] in 2-3 weeks   Past Medical History:  Diagnosis Date  . Arthritis   . Chest pain   . Headache(784.0)   . Hyperlipidemia   . Hypertension     Past Surgical History:  Procedure Laterality Date  . ABDOMINAL HYSTERECTOMY     complete  . CHOLECYSTECTOMY    . HUMERUS FRACTURE SURGERY     left arm  . VAGINAL DELIVERY     5     Current Outpatient Prescriptions  Medication Sig Dispense Refill  . aspirin 81 MG tablet Take 81 mg by mouth daily.    Marland Kitchen atorvastatin (LIPITOR) 40 MG tablet TOME MEDIA TABLETA (1/2) POR VIA ORAL TODOS LOS DIAS 45 tablet 1  . docusate sodium (COLACE) 100 MG capsule Take 1 capsule (100 mg total) by mouth 2 (two) times daily. 60 capsule 0  . hydrochlorothiazide (HYDRODIURIL) 25 MG tablet TAKE 1 TABLET (25 MG TOTAL) BY MOUTH DAILY. 90 tablet 0  . KLOR-CON 10 10 MEQ tablet TOME UNA TABLETA POR VIA ORAL TODOS LOS DIAS 90 tablet 1  . loratadine (CLARITIN) 10 MG tablet Take 1 tablet (10 mg total) by mouth daily. 30 tablet 11  .  losartan (COZAAR) 100 MG tablet TOME UNA TABLETA POR VIA ORAL A DIARIO 90 tablet 0  . metoprolol (LOPRESSOR) 100 MG tablet TAKE 1/2 A TABLET BY MOUTH 2 TIMES DAILY. 90 tablet 1  . pantoprazole (PROTONIX) 40 MG tablet Take 1 tablet (40 mg total) by mouth daily. 30 tablet 3  . traMADol (ULTRAM) 50 MG tablet Take 1 tablet (50 mg total) by mouth every 8 (eight) hours as needed. 30 tablet 0   No current facility-administered medications for this visit.     Allergies:   Lisinopril    Social History:  The patient  reports that she has never smoked. She has never used smokeless tobacco. She reports that she drinks alcohol. She reports that she does not use drugs.   Family History:  The patient's family history includes Cancer in her sister.    ROS:  Please see the history  of present illness.    Review of Systems: Constitutional:  denies fever, chills, diaphoresis, appetite change and fatigue.  HEENT: denies photophobia, eye pain, redness, hearing loss, ear pain, congestion, sore throat, rhinorrhea, sneezing, neck pain, neck stiffness and tinnitus.  Respiratory: denies SOB, DOE, cough, chest tightness, and wheezing.  Cardiovascular: denies chest pain, palpitations and leg swelling.  Gastrointestinal: denies nausea, vomiting, abdominal pain, diarrhea, constipation, blood in stool.  Genitourinary: denies dysuria, urgency, frequency, hematuria, flank pain and difficulty urinating.  Musculoskeletal: denies  myalgias, back pain, joint swelling, arthralgias and gait problem.   Skin: denies pallor, rash and wound.  Neurological: denies dizziness, seizures, syncope, weakness, light-headedness, numbness and headaches.   Hematological: denies adenopathy, easy bruising, personal or family bleeding history.  Psychiatric/ Behavioral: denies suicidal ideation, mood changes, confusion, nervousness, sleep disturbance and agitation.       All other systems are reviewed and negative.    PHYSICAL EXAM: VS:  BP (!) 152/72 (BP Location: Left Arm, Patient Position: Sitting, Cuff Size: Normal)   Pulse 68   Ht  (1.575 m)   Wt 142 lb 6.4 oz (64.6 kg)   SpO2 97%   BMI 26.05 kg/m  , BMI Body mass index is 26.05 kg/m. GEN: Well nourished, well developed, in no acute distress  HEENT: normal  Neck: no JVD, carotid bruits, or masses Cardiac: RRR; no murmurs, rubs, or gallops,no edema  Respiratory:  clear to auscultation bilaterally, normal work of breathing GI: soft, nontender, nondistended, + BS MS: no deformity or atrophy  Skin: warm and dry,   scaley rash on arms  Neuro:  Strength and sensation are intact Psych: normal   EKG:  EKG is ordered today. April 22, 2017:  NSR at 5.   NS ST / T wave abn.   Recent Labs: 03/31/2017: ALT 15; BUN 17; Creatinine, Ser 0.79;  Hemoglobin 13.1; Platelets 261.0; Potassium 4.6; Sodium 140    Lipid Panel    Component Value Date/Time   CHOL 154 03/31/2017 1026   CHOL 170 03/05/2015 1212   TRIG 171.0 (H) 03/31/2017 1026   HDL 52.40 03/31/2017 1026   HDL 51 03/05/2015 1212   CHOLHDL 3 03/31/2017 1026   VLDL 34.2 03/31/2017 1026   LDLCALC 67 03/31/2017 1026   LDLCALC 64 03/05/2015 1212   LDLDIRECT 81.1 05/30/2013 1642      Wt Readings from Last 3 Encounters:  04/22/17 142 lb 6.4 oz (64.6 kg)  03/31/17 139 lb 6.4 oz (63.2 kg)  09/18/16 140 lb 4 oz (63.6 kg)      Other studies Reviewed: Additional studies/ records  that were reviewed today include: . Review of the above records demonstrates:    ASSESSMENT AND PLAN:   1. Tachycardia: Michele Rogers is seen today  complaining of tachycardia. He's episodes last for as long as 30 minutes. She cannot tell if they are regular or irregular. She does not have any syncope or presyncope.  She is on metoprolol 50 mg twice a day. We will increase the metoprolol to 100 mg twice a day. We will place an event monitor on her to make sure that she is not having atrial fibrillation. She is leaving for [place] in just over 2 weeks.  2..  Essential hypertension: Michele Rogers is doing very well.    Will check BMP today  3. Hyperlipidemia:   Will check lipids and liver enz today   I'll see her again in 6 months for follow-up visit.   Current medicines are reviewed at length with the patient today.  The patient does not have concerns regarding medicines.  The following changes have been made:  no change   Disposition:   FU with me in 6 months     Signed, Kristeen Miss, MD  04/22/2017 3:49 PM    [facility] [address] [place], [place], [place]  [address] Phone: [phone]; Fax: [phone]

## 2017-04-23 LAB — COMPREHENSIVE METABOLIC PANEL
ALBUMIN: 4.2 g/dL (ref 3.5–4.8)
ALK PHOS: 95 IU/L (ref 39–117)
ALT: 18 IU/L (ref 0–32)
AST: 23 IU/L (ref 0–40)
Albumin/Globulin Ratio: 1.4 (ref 1.2–2.2)
BUN / CREAT RATIO: 11 — AB (ref 12–28)
BUN: 10 mg/dL (ref 8–27)
Bilirubin Total: 1.2 mg/dL (ref 0.0–1.2)
CO2: 28 mmol/L (ref 18–29)
CREATININE: 0.87 mg/dL (ref 0.57–1.00)
Calcium: 9.5 mg/dL (ref 8.7–10.3)
Chloride: 99 mmol/L (ref 96–106)
GFR calc non Af Amer: 64 mL/min/{1.73_m2} (ref >59)
GFR, EST AFRICAN AMERICAN: 73 mL/min/{1.73_m2} (ref >59)
Globulin, Total: 3.1 g/dL (ref 1.5–4.5)
Glucose: 97 mg/dL (ref 65–99)
Potassium: 5.1 mmol/L (ref 3.5–5.2)
Sodium: 141 mmol/L (ref 134–144)
TOTAL PROTEIN: 7.3 g/dL (ref 6.0–8.5)

## 2017-04-23 LAB — CBC
HEMATOCRIT: 37.2 % (ref 34.0–46.6)
HEMOGLOBIN: 12.3 g/dL (ref 11.1–15.9)
MCH: 32.1 pg (ref 26.6–33.0)
MCHC: 33.1 g/dL (ref 31.5–35.7)
MCV: 97 fL (ref 79–97)
Platelets: 276 10*3/uL (ref 150–379)
RBC: 3.83 x10E6/uL (ref 3.77–5.28)
RDW: 13.5 % (ref 12.3–15.4)
WBC: 8.3 10*3/uL (ref 3.4–10.8)

## 2017-04-23 LAB — LIPID PANEL
CHOLESTEROL TOTAL: 162 mg/dL (ref 100–199)
Chol/HDL Ratio: 2.9 ratio (ref 0.0–4.4)
HDL: 56 mg/dL (ref >39)
LDL Calculated: 82 mg/dL (ref 0–99)
Triglycerides: 119 mg/dL (ref 0–149)
VLDL CHOLESTEROL CAL: 24 mg/dL (ref 5–40)

## 2017-04-23 LAB — TSH: TSH: 1.83 u[IU]/mL (ref 0.450–4.500)

## 2017-04-30 ENCOUNTER — Ambulatory Visit (INDEPENDENT_AMBULATORY_CARE_PROVIDER_SITE_OTHER): Payer: Medicaid Other | Admitting: Family Medicine

## 2017-04-30 ENCOUNTER — Encounter: Payer: Self-pay | Admitting: Family Medicine

## 2017-04-30 DIAGNOSIS — I10 Essential (primary) hypertension: Secondary | ICD-10-CM | POA: Diagnosis not present

## 2017-04-30 DIAGNOSIS — K219 Gastro-esophageal reflux disease without esophagitis: Secondary | ICD-10-CM

## 2017-04-30 MED ORDER — POTASSIUM CHLORIDE ER 10 MEQ PO TBCR: EXTENDED_RELEASE_TABLET | ORAL | 3 refills | Status: DC

## 2017-04-30 MED ORDER — LOSARTAN POTASSIUM 100 MG PO TABS: ORAL_TABLET | ORAL | 3 refills | Status: DC

## 2017-04-30 MED ORDER — HYDROCHLOROTHIAZIDE 25 MG PO TABS: ORAL_TABLET | ORAL | 3 refills | Status: DC

## 2017-04-30 NOTE — Assessment & Plan Note (Addendum)
Improved Continue PPI  

## 2017-04-30 NOTE — Assessment & Plan Note (Signed)
Stable. Continue meds - HCTZ, Losartan, Metoprolol. Refills today.

## 2017-04-30 NOTE — Progress Notes (Signed)
Subjective:  Patient ID: Michele Rogers, female    DOB: Mar 20, 1935  Age: 82 y.o. MRN: [id number]  CC:  Follow up  HPI:  82 year old female with hypertension, hyperlipidemia, GERD presents for follow-up.  GERD, Early satiety  GERD is improved.  Patient continues to endorse early satiety but is maintaining weight. Her granddaughter feels like she is eating normally.  Patient has no complaints at this time.  Hypertension  At goal on HCTZ, losartan, metoprolol. Needs refills today.  Social Hx   Social History   Social History  . Marital status: Widowed    Spouse name: N/A  . Number of children: N/A  . Years of education: N/A   Social History Main Topics  . Smoking status: Never Smoker  . Smokeless tobacco: Never Used  . Alcohol use 0.0 oz/week     Comment: one drink every couple months  . Drug use: No  . Sexual activity: Not Asked   Other Topics Concern  . None   Social History Narrative   Lives in [place], from [place]. Lives with daughter, granddaughter. No pets.     Review of Systems  Constitutional: Negative.   Gastrointestinal: Negative for abdominal pain, nausea and vomiting.   Objective:  BP 132/62   Pulse 79   Temp 98.5 F (36.9 C) (Oral)   Wt 140 lb 4 oz (63.6 kg)   SpO2 93%   BMI 25.65 kg/m   BP/Weight 04/30/2017 04/22/2017 03/31/2017  Systolic BP 132 152 128  Diastolic BP 62 72 78  Wt. (Lbs) 140.25 142.4 139.4  BMI 25.65 26.05 25.5   Physical Exam  Constitutional: She is oriented to person, place, and time. She appears well-developed. No distress.  Cardiovascular: Normal rate and regular rhythm.   Pulmonary/Chest: Effort normal and breath sounds normal.  Abdominal: Soft. She exhibits no distension. There is no tenderness.  Neurological: She is alert and oriented to person, place, and time.  Psychiatric: She has a normal mood and affect.  Vitals reviewed.   Lab Results  Component Value Date   WBC 8.3 04/22/2017   HGB 13.1 03/31/2017    HCT 37.2 04/22/2017   PLT 276 04/22/2017   GLUCOSE 97 04/22/2017   CHOL 162 04/22/2017   TRIG 119 04/22/2017   HDL 56 04/22/2017   LDLDIRECT 81.1 05/30/2013   LDLCALC 82 04/22/2017   ALT 18 04/22/2017   AST 23 04/22/2017   NA 141 04/22/2017   K 5.1 04/22/2017   CL 99 04/22/2017   CREATININE 0.87 04/22/2017   BUN 10 04/22/2017   CO2 28 04/22/2017   TSH 1.830 04/22/2017   INR 0.8 04/20/2012   HGBA1C 6.2 03/31/2017   MICROALBUR 0.1 05/30/2013    Assessment & Plan:   Problem List Items Addressed This Visit    GERD (gastroesophageal reflux disease)    Improved. Continue PPI.      Hypertension    Stable. Continue meds - HCTZ, Losartan, Metoprolol. Refills today.      Relevant Medications   hydrochlorothiazide (HYDRODIURIL) 25 MG tablet   losartan (COZAAR) 100 MG tablet      Meds ordered this encounter  Medications  . hydrochlorothiazide (HYDRODIURIL) 25 MG tablet    Sig: TAKE 1 TABLET (25 MG TOTAL) BY MOUTH DAILY.    Dispense:  90 tablet    Refill:  3  . losartan (COZAAR) 100 MG tablet    Sig: TOME UNA TABLETA POR VIA ORAL A DIARIO    Dispense:  90 tablet  Refill:  3    Please keep upcoming appointment for future refills. Thank you  . potassium chloride (KLOR-CON 10) 10 MEQ tablet    Sig: TOME UNA TABLETA POR VIA ORAL TODOS LOS DIAS    Dispense:  90 tablet    Refill:  3   Follow-up: 6 months  Flecia Shutter Adriana Simasook DO [facility] [place]

## 2017-04-30 NOTE — Patient Instructions (Signed)
Continue medications.  Follow up in 6 months.  Enjoy your trip.  Take care  Dr. Adriana Simasook

## 2017-04-30 NOTE — Progress Notes (Signed)
Pre visit review using our clinic review tool, if applicable. No additional management support is needed unless otherwise documented below in the visit note. 

## 2017-05-04 ENCOUNTER — Other Ambulatory Visit: Payer: Self-pay | Admitting: Cardiovascular Disease

## 2017-05-04 ENCOUNTER — Encounter (INDEPENDENT_AMBULATORY_CARE_PROVIDER_SITE_OTHER): Payer: Medicaid Other

## 2017-05-04 DIAGNOSIS — R000 Tachycardia, unspecified: Secondary | ICD-10-CM | POA: Diagnosis not present

## 2017-05-04 DIAGNOSIS — R002 Palpitations: Secondary | ICD-10-CM

## 2017-05-26 ENCOUNTER — Other Ambulatory Visit: Payer: Self-pay | Admitting: Cardiovascular Disease

## 2017-08-11 ENCOUNTER — Ambulatory Visit: Payer: Self-pay | Admitting: Cardiovascular Disease

## 2017-10-02 ENCOUNTER — Encounter: Payer: Self-pay | Admitting: Cardiovascular Disease

## 2017-10-02 ENCOUNTER — Ambulatory Visit (INDEPENDENT_AMBULATORY_CARE_PROVIDER_SITE_OTHER): Payer: Medicaid Other | Admitting: Cardiovascular Disease

## 2017-10-02 ENCOUNTER — Encounter (INDEPENDENT_AMBULATORY_CARE_PROVIDER_SITE_OTHER): Payer: Self-pay

## 2017-10-02 VITALS — BP 120/68 | HR 86 | Ht 62.0 in | Wt 136.0 lb

## 2017-10-02 DIAGNOSIS — E782 Mixed hyperlipidemia: Secondary | ICD-10-CM

## 2017-10-02 DIAGNOSIS — I10 Essential (primary) hypertension: Secondary | ICD-10-CM | POA: Diagnosis not present

## 2017-10-02 NOTE — Patient Instructions (Signed)
Medication Instructions:  Your physician recommends that you continue on your current medications as directed. Please refer to the Current Medication list given to you today.   Labwork: TODAY - cholesterol, liver panel, basic metabolic panel   Testing/Procedures: None Ordered   Follow-Up: Your physician wants you to follow-up in: 6 months with Dr. Nahser. You will receive a reminder letter in the mail two months in advance. If you don't receive a letter, please call our office to schedule the follow-up appointment.   If you need a refill on your cardiac medications before your next appointment, please call your pharmacy.   Thank you for choosing [facility]! Hansika Leaming, RN [phone]    

## 2017-10-02 NOTE — Progress Notes (Signed)
Cardiology Office Note   Date:  10/02/2017   ID:  Michele Rogers, DOB February 04, 1935, MRN [id number]  PCP:  Tommie Sams, DO  Cardiologist:   Kristeen Miss, MD   Chief Complaint  Patient presents with  . Follow-up    HTN, hyperlipidemia     1. Hypertension  History of Present Illness:  Michele Rogers is a 82 yo female from [place] who is seen today with her granddaughter. They are saddened today due to the death of her Michele Rogers ( granddaughter's brother) He was found dead in his bed yesterday.  She's done very well on the Losartan. Initially she was having some difficulty in getting Medicaid to improve the Losartan but she developed a cough with the lisinopril and they ultimately have approved the losartan.  Her blood pressures been doing well. She's been avoiding extra salt.  May 03, 2013:  Michele Rogers has had some dizziness back in February. The episodes have resolved.  She has not been exercising - is about to start.   August 10, 2012:  Michele Rogers is doing well. We prescribed Hydrochlorothiazide and potassium chloride during her last visit. She took both of these for one month and then never had the prescriptions refilled. She complains of some mild leg swelling.  She's scheduled to leave for [place] in September and will stay through January. She's not having any cardiac complaints.  Feb. 23, 2015:  Michele Rogers is doing OK. Occasional ankle edema and mild headaches.   August 23, 2014:  March 05, 2015:  Michele Rogers is a 82 y.o. female who presents for follow-up of her hypertension. Was seen with Michele Rogers  Sept. 28 , 2016 Pt was seen today with Michele Rogers , Intrepreter.  Feeling well, able to do all of her usual activities without any dyspnea or fatigue .   April 15, 2016:    Seen with Michele Rogers    Intrepreter.  Doing well,  Feeling well No CP  Not exercising any , does not enjoy it .   Walks on occasion   April 25. 2018  Michele Rogers is seen - using the  Spanish intrepreter video - stratus. Michele Rogers # [phone] and Michele Rogers [phone]) )  Having palpitations - fast HR  Last for 30 minutes or so  No syncope ,  No CP , no dyspnea Is leaving for [place] in 2-3 weeks   Oct. 5, 2018:  Is doing well Traveling back to [place] again soon  Walking a fair amount   Past Medical History:  Diagnosis Date  . Arthritis   . Chest pain   . Headache(784.0)   . Hyperlipidemia   . Hypertension     Past Surgical History:  Procedure Laterality Date  . ABDOMINAL HYSTERECTOMY     complete  . CHOLECYSTECTOMY    . HUMERUS FRACTURE SURGERY     left arm  . VAGINAL DELIVERY     5     Current Outpatient Prescriptions  Medication Sig Dispense Refill  . aspirin 81 MG tablet Take 81 mg by mouth daily.    Marland Kitchen atorvastatin (LIPITOR) 40 MG tablet TOME MEDIA TABLETA (1/2) POR VIA ORAL TODOS LOS DIAS 45 tablet 3  . docusate sodium (COLACE) 100 MG capsule Take 1 capsule (100 mg total) by mouth 2 (two) times daily. 60 capsule 0  . hydrochlorothiazide (HYDRODIURIL) 25 MG tablet TAKE 1 TABLET (25 MG TOTAL) BY MOUTH DAILY. 90 tablet 3  . loratadine (CLARITIN) 10 MG tablet Take 1 tablet (10  mg total) by mouth daily. 30 tablet 11  . losartan (COZAAR) 100 MG tablet TOME UNA TABLETA POR VIA ORAL A DIARIO 90 tablet 3  . metoprolol (LOPRESSOR) 100 MG tablet Take 1 tablet (100 mg total) by mouth 2 (two) times daily. 180 tablet 3  . pantoprazole (PROTONIX) 40 MG tablet Take 1 tablet (40 mg total) by mouth daily. 30 tablet 3  . potassium chloride (KLOR-CON 10) 10 MEQ tablet TOME UNA TABLETA POR VIA ORAL TODOS LOS DIAS 90 tablet 3  . traMADol (ULTRAM) 50 MG tablet Take 1 tablet (50 mg total) by mouth every 8 (eight) hours as needed. 30 tablet 0   No current facility-administered medications for this visit.     Allergies:   Lisinopril    Social History:  The patient  reports that she has never smoked. She has never used smokeless tobacco. She reports that she drinks alcohol.  She reports that she does not use drugs.   Family History:  The patient's family history includes Cancer in her sister.    ROS:  Please see the history of present illness.    Review of Systems: Constitutional:  denies fever, chills, diaphoresis, appetite change and fatigue.  HEENT: denies photophobia, eye pain, redness, hearing loss, ear pain, congestion, sore throat, rhinorrhea, sneezing, neck pain, neck stiffness and tinnitus.  Respiratory: denies SOB, DOE, cough, chest tightness, and wheezing.  Cardiovascular: denies chest pain, palpitations and leg swelling.  Gastrointestinal: denies nausea, vomiting, abdominal pain, diarrhea, constipation, blood in stool.  Genitourinary: denies dysuria, urgency, frequency, hematuria, flank pain and difficulty urinating.  Musculoskeletal: denies  myalgias, back pain, joint swelling, arthralgias and gait problem.   Skin: denies pallor, rash and wound.  Neurological: denies dizziness, seizures, syncope, weakness, light-headedness, numbness and headaches.   Hematological: denies adenopathy, easy bruising, personal or family bleeding history.  Psychiatric/ Behavioral: denies suicidal ideation, mood changes, confusion, nervousness, sleep disturbance and agitation.       All other systems are reviewed and negative.   Physical Exam: Blood pressure 120/68, pulse 86, height  (1.575 m), weight 136 lb (61.7 kg), SpO2 97 %.  GEN:  Well nourished, well developed in no acute distress HEENT: Normal NECK: No JVD; No carotid bruits LYMPHATICS: No lymphadenopathy CARDIAC: RR, no murmurs, rubs, gallops RESPIRATORY:  Clear to auscultation without rales, wheezing or rhonchi  ABDOMEN: Soft, non-tender, non-distended MUSCULOSKELETAL:  No edema; No deformity  SKIN: Warm and dry NEUROLOGIC:  Alert and oriented x 3   EKG:  EKG is not ordered today.  Recent Labs: 04/22/2017: ALT 18; BUN 10; Creatinine, Ser 0.87; Hemoglobin 12.3; Platelets 276; Potassium 5.1;  Sodium 141; TSH 1.830    Lipid Panel    Component Value Date/Time   CHOL 162 04/22/2017 1627   TRIG 119 04/22/2017 1627   HDL 56 04/22/2017 1627   CHOLHDL 2.9 04/22/2017 1627   CHOLHDL 3 03/31/2017 1026   VLDL 34.2 03/31/2017 1026   LDLCALC 82 04/22/2017 1627   LDLDIRECT 81.1 05/30/2013 1642    Wt Readings from Last 3 Encounters:  10/02/17 136 lb (61.7 kg)  04/30/17 140 lb 4 oz (63.6 kg)  04/22/17 142 lb 6.4 oz (64.6 kg)      Other studies Reviewed: Additional studies/ records that were reviewed today include: . Review of the above records demonstrates:    ASSESSMENT AND PLAN:  1. Tachycardia:  HR is well controlled  Continue metoprolol  2.. Essential hypertension: blood pressure is well controlled.   contineue meds  3. Hyperlipidemia:   Labs have been good.  Check labs today   I'll see her again in 6 months for follow-up visit.   Current medicines are reviewed at length with the patient today.  The patient does not have concerns regarding medicines.  The following changes have been made:  no change   Disposition:   FU with me in 6 months     Signed, Kristeen Miss, MD  10/02/2017 11:13 AM    [facility] [address] [place], [place], [place]  [address] Phone: [phone]; Fax: [phone]

## 2017-10-03 LAB — BASIC METABOLIC PANEL
BUN/Creatinine Ratio: 14 (ref 12–28)
BUN: 13 mg/dL (ref 8–27)
CALCIUM: 9.4 mg/dL (ref 8.7–10.3)
CO2: 28 mmol/L (ref 20–29)
Chloride: 98 mmol/L (ref 96–106)
Creatinine, Ser: 0.95 mg/dL (ref 0.57–1.00)
GFR calc Af Amer: 66 mL/min/{1.73_m2} (ref >59)
GFR, EST NON AFRICAN AMERICAN: 57 mL/min/{1.73_m2} — AB (ref >59)
GLUCOSE: 87 mg/dL (ref 65–99)
Potassium: 4.1 mmol/L (ref 3.5–5.2)
Sodium: 140 mmol/L (ref 134–144)

## 2017-10-03 LAB — HEPATIC FUNCTION PANEL
ALK PHOS: 89 IU/L (ref 39–117)
ALT: 11 IU/L (ref 0–32)
AST: 21 IU/L (ref 0–40)
Albumin: 4.2 g/dL (ref 3.5–4.8)
Bilirubin Total: 1.4 mg/dL — ABNORMAL HIGH (ref 0.0–1.2)
Bilirubin, Direct: 0.32 mg/dL (ref 0.00–0.40)
TOTAL PROTEIN: 7.4 g/dL (ref 6.0–8.5)

## 2017-10-03 LAB — LIPID PANEL
Chol/HDL Ratio: 2.7 ratio (ref 0.0–4.4)
Cholesterol, Total: 140 mg/dL (ref 100–199)
HDL: 52 mg/dL (ref >39)
LDL CALC: 53 mg/dL (ref 0–99)
Triglycerides: 177 mg/dL — ABNORMAL HIGH (ref 0–149)
VLDL CHOLESTEROL CAL: 35 mg/dL (ref 5–40)

## 2017-10-23 ENCOUNTER — Other Ambulatory Visit: Payer: Self-pay | Admitting: Family Medicine

## 2017-11-02 ENCOUNTER — Ambulatory Visit: Payer: Self-pay | Admitting: Family Medicine

## 2018-03-30 ENCOUNTER — Ambulatory Visit: Payer: Medicaid Other | Admitting: Family Medicine

## 2018-03-30 ENCOUNTER — Encounter: Payer: Self-pay | Admitting: Family Medicine

## 2018-03-30 VITALS — BP 126/80 | HR 77 | Ht 62.0 in | Wt 135.2 lb

## 2018-03-30 DIAGNOSIS — E785 Hyperlipidemia, unspecified: Secondary | ICD-10-CM

## 2018-03-30 DIAGNOSIS — K219 Gastro-esophageal reflux disease without esophagitis: Secondary | ICD-10-CM

## 2018-03-30 DIAGNOSIS — E2839 Other primary ovarian failure: Secondary | ICD-10-CM

## 2018-03-30 DIAGNOSIS — R000 Tachycardia, unspecified: Secondary | ICD-10-CM

## 2018-03-30 DIAGNOSIS — E559 Vitamin D deficiency, unspecified: Secondary | ICD-10-CM | POA: Diagnosis not present

## 2018-03-30 DIAGNOSIS — I10 Essential (primary) hypertension: Secondary | ICD-10-CM | POA: Diagnosis not present

## 2018-03-30 DIAGNOSIS — E860 Dehydration: Secondary | ICD-10-CM

## 2018-03-30 DIAGNOSIS — K5901 Slow transit constipation: Secondary | ICD-10-CM

## 2018-03-30 LAB — URINALYSIS, ROUTINE W REFLEX MICROSCOPIC
BILIRUBIN URINE: NEGATIVE
Ketones, ur: NEGATIVE
Nitrite: NEGATIVE
PH: 7 (ref 5.0–8.0)
Specific Gravity, Urine: 1.015 (ref 1.000–1.030)
TOTAL PROTEIN, URINE-UPE24: NEGATIVE
URINE GLUCOSE: NEGATIVE
Urobilinogen, UA: 0.2 (ref 0.0–1.0)

## 2018-03-30 LAB — LIPID PANEL
CHOL/HDL RATIO: 3
Cholesterol: 146 mg/dL (ref 0–200)
HDL: 52.7 mg/dL (ref >39.00)
LDL Cholesterol: 66 mg/dL (ref 0–99)
NONHDL: 93.51
Triglycerides: 138 mg/dL (ref 0.0–149.0)
VLDL: 27.6 mg/dL (ref 0.0–40.0)

## 2018-03-30 LAB — CBC
HCT: 40.2 % (ref 36.0–46.0)
HEMOGLOBIN: 13.5 g/dL (ref 12.0–15.0)
MCHC: 33.6 g/dL (ref 30.0–36.0)
MCV: 98.3 fl (ref 78.0–100.0)
Platelets: 239 10*3/uL (ref 150.0–400.0)
RBC: 4.09 Mil/uL (ref 3.87–5.11)
RDW: 13.6 % (ref 11.5–15.5)
WBC: 6 10*3/uL (ref 4.0–10.5)

## 2018-03-30 LAB — COMPREHENSIVE METABOLIC PANEL
ALT: 18 U/L (ref 0–35)
AST: 22 U/L (ref 0–37)
Albumin: 3.9 g/dL (ref 3.5–5.2)
Alkaline Phosphatase: 96 U/L (ref 39–117)
BILIRUBIN TOTAL: 1.7 mg/dL — AB (ref 0.2–1.2)
BUN: 17 mg/dL (ref 6–23)
CO2: 31 meq/L (ref 19–32)
CREATININE: 0.77 mg/dL (ref 0.40–1.20)
Calcium: 9.7 mg/dL (ref 8.4–10.5)
Chloride: 104 mEq/L (ref 96–112)
GFR: 76.59 mL/min (ref >60.00)
GLUCOSE: 108 mg/dL — AB (ref 70–99)
Potassium: 4.4 mEq/L (ref 3.5–5.1)
SODIUM: 143 meq/L (ref 135–145)
Total Protein: 7.7 g/dL (ref 6.0–8.3)

## 2018-03-30 LAB — VITAMIN D 25 HYDROXY (VIT D DEFICIENCY, FRACTURES): VITD: 33.76 ng/mL (ref 30.00–100.00)

## 2018-03-30 NOTE — Patient Instructions (Signed)
Deshidratacin en los adultos Dehydration, Adult La deshidratacin es un cuadro clnico que se caracteriza por la cantidad insuficiente de lquido o agua en el organismo. Esto ocurre cuando se pierden ms lquidos de los que se ingieren. Los Hess Corporationrganos importantes, como los riones, el cerebro y el corazn, no pueden funcionar sin la cantidad [place]val[place] Jan May[place] Islandsadecuada de lquidos. Cualquier prdida de lquidos del organismo puede causar deshidratacin. La deshidratacin puede ser leve o grave. Este cuadro clnico se debe tratar de inmediato para evitar que se agrave. Cules son las causas? Esta afeccin puede ser causada por lo siguiente:  Vmitos.  Diarrea.  Sudoracin excesiva, por ejemplo, por la exposicin al calor o por la actividad fsica.  Ingesta insuficiente de lquido, especialmente en estos casos: ? Cuando se est enfermo. ? Mientras se realizan actividades que exigen mucha [place]energa.  Miccin excesiva.  Grant RutsFiebre.  Una infeccin.  Determinados medicamentos, como aquellos que estimulan al cuerpo a eliminar el exceso de lquido (diurticos).  Imposibilidad de acceder a agua potable segura.  Disminucin de la capacidad fsica para obtener el agua y los alimentos Athenaadecuados.  Qu incrementa el riesgo? Es ms probable que Dietitianesta afeccin se manifieste en las personas que:  Tienen una enfermedad prolongada (crnica) que no est bien tratada, como diabetes, enfermedades cardacas o renales.  Son [place]mayores de [place]65aos.  Tienen alguna discapacidad.  Viven a gran altitud.  Practican deportes de resistencia.  Cules son los signos o los sntomas? Los sntomas de la deshidratacin leve pueden incluir los siguientes:  Sed.  Labios secos.  Sequedad leve en la boca.  Piel seca y caliente.  Mareos. Los sntomas de la deshidratacin moderada pueden incluir los siguientes:  [place]oca muy seca.  Calambres musculares.  Orina de color oscuro. La orina puede ser color t.  Disminucin de la  produccin de [place].  Disminucin en la produccin de lgrimas.  Latido cardaco irregular o ms rpido que lo normal (palpitaciones).  Dolor de Turkmenistancabeza.  Sensacin de desvanecimiento, especialmente al ponerse de pie.  Desmayos (sncope). Los sntomas de deshidratacin grave pueden incluir los siguientes:  Cambios en la piel, por ejemplo: ? Piel fra y hmeda. ? Piel manchada (moteada) o plida. ? Piel que no vuelve rpidamente a la normalidad cuando se la suelta luego de Counselling psychologistpellizcarla ligeramente (persistencia del pliegue cutneo).  Cambios en los lquidos corporales, por ejemplo: ? Mucha sed. ? Ausencia de la produccin de lgrimas. ? Incapacidad de transpirar cuando la temperatura corporal es alta, por ejemplo, cuando hace calor. ? Produccin muy escasa de [place]or[place].  Cambios en los signos vitales, por ejemplo: ? Pulso dbil. ? Pulso que supera los 100latidos por minuto cuando se est sentado quieto. ? Respiracin rpida. ? Presin arterial baja.  Otros cambios, por ejemplo: ? Ojos hundidos. ? Manos y pies fros. ? Confusin. ? Falta de energa Retail buyer(letargo). ? Dificultad para despertarse. ? Prdida de peso a Product managercorto plazo. ? Prdida del conocimiento. Cmo se diagnostica? Esta afeccin se diagnostica en funcin de los sntomas y de un examen fsico. Pueden hacerle anlisis de sangre y de orina para confirmar el diagnstico. Cmo se trata esta afeccin? El tratamiento de este cuadro clnico depende de la gravedad. La deshidratacin leve o moderada a menudo puede tratarse en la casa. El tratamiento se debe comenzar de inmediato. No espere hasta que la deshidratacin sea grave. La deshidratacin grave es Radio broadcast assistantuna emergencia y debe tratarse en un hospital. El tratamiento de la deshidratacin leve puede incluir lo siguiente:  Beber ms lquidos.  Reemplazar las Airline pilotsales  y los minerales de la sangre (electrolitos) que se pueden haber perdido. El tratamiento de la deshidratacin moderada  puede incluir lo siguiente:  Donia Pounds solucin de rehidratacin oral (SRO). Se trata de una bebida que lo ayuda a reponer los lquidos y Water engineer (rehidratarse). Se la puede encontrar en farmacias y tiendas minoristas. El tratamiento de la deshidratacin grave puede incluir lo siguiente:  Recibir lquidos a travs de un tubo (catter) intravenoso.  Recibir una solucin de electrolitos a travs de una sonda de alimentacin que se coloca a travs de la nariz hasta el estmago (sonda nasogstrica o sonda NG).  Corregir las ONEOK.  Tratar la causa preexistente de la deshidratacin. Siga estas indicaciones en su casa:  Si el mdico se lo indic, beba una SRO: ? Para preparar una SRO, siga las indicaciones del envase. ? Comience por beber pequeas cantidades, aproximadamente taza ( ) cada 5 a . ? Aumente lentamente la cantidad que bebe hasta que haya ingerido la cantidad recomendada por el mdico.  Beba suficiente lquido transparente para mantener la orina clara o de color amarillo plido. Si le indicaron que beba una SRO, termine primero la solucin y luego empiece a beber lentamente otros lquidos transparentes. Beba lquidos, por ejemplo: ? [place]. No beba solo agua. Esto puede derivar en la escasez de sal (sodio) en el organismo (hiponatremia). ? Cubitos de hielo. ? Jugo de frutas con agregado de agua (jugo de frutas diluido). ? Bebidas deportivas de bajas caloras.  Evite lo siguiente: ? Alcohol. ? Bebidas con mucha azcar. Entre estas se incluyen las bebidas deportivas ricas en caloras, el jugo de frutas sin diluir y los refrescos o gaseosas. ? Cafena. ? Alimentos muy grasos o azucarados.  Tome los medicamentos de venta libre y los recetados solamente como se lo haya indicado el mdico.  No tome comprimidos de [place]. Esto puede aumentar la concentracin de sodio en el cuerpo (hipernatremia).  Coma alimentos con un equilibrio sano  de electrolitos, como bananas, naranjas, papas, tomates y espinaca.  Concurra a todas las visitas de control como se lo haya indicado el mdico. Esto es importante. Comunquese con un mdico si:  Siente dolor abdominal que: ? Empeora. ? Se siente en una sola zona (localizado).  Tiene una erupcin cutnea.  Presenta rigidez en el cuello.  Se siente ms irritable o sensible de lo habitual.  Tiene ms sueo o le cuesta ms despertarse de lo habitual.  Se siente dbil o mareado.  Tiene mucha sed.  Solo ha orinado una cantidad pequea de [place] de color muy oscuro en el trmino de 6 a 8horas. Solicite ayuda de inmediato si:  Tiene sntomas de deshidratacin grave.  No puede beber lquidos sin vomitar.  Los sntomas empeoran con Scientist, research (medical).  Tiene fiebre.  Tiene un dolor de cabeza intenso.  Tiene vmitos o diarrea que: ? Empeoran. ? No desaparecen.  Observa sangre o una sustancia verde (bilis) en el vmito.  Maxie Better en la materia fecal. Esto puede hacer que la materia fecal sea negra y de aspecto alquitranado.  No ha orinado en el trmino de 6 a 8horas.  Se desmaya.  La frecuencia cardaca mientras est sentado quieto supera los 100latidos por minuto.  Tiene dificultad para respirar. Esta informacin no tiene Theme park manager el consejo del mdico. Asegrese de hacerle al mdico cualquier pregunta que tenga. Document Released: 12/15/2005 Document Revised: 03/19/2017 Document Reviewed: 02/08/2016 Elsevier Interactive Patient Education  2018 [employer].  Estreimiento, en adultos Constipation,  Adult Estreimiento significa que una persona defeca en una semana menos que lo normal, tiene dificultad para defecar, o las heces son secas, duras, o ms grandes que lo normal. El estreimiento podra estar provocado por una enfermedad preexistente. Puede empeorar con la edad si una persona toma ciertos medicamentos y no toma suficiente lquido. Siga estas  indicaciones en su casa: Qu debe comer y beber   Consuma alimentos con alto contenido de [place], como frutas y verduras frescas, cereales integrales y frijoles.  Limite los alimentos ricos en grasas y con bajo contenido de [place], o muy procesados, como las papas fritas, [place], [place], dulces y refrescos.  Beba suficiente lquido para Photographer orina clara o de color amarillo plido. Instrucciones generales  Haga actividad fsica habitualmente o como se lo haya indicado el mdico.  Vaya al bao cuando sienta la necesidad de ir. No se aguante las ganas.  Tome los medicamentos de venta libre y los recetados solamente como se lo haya indicado el mdico. Estos incluyen los suplementos de [place].  Practique ejercicios de rehabilitacin del suelo plvico, como la respiracin profunda mientras relaja la parte inferior del abdomen y la relajacin del suelo plvico mientras defeca.  Controle su afeccin para Insurance risk surveyor cambio.  Concurra a todas las visitas de [address] se lo haya indicado el mdico. Esto es importante. Comunquese con un mdico si:  Su dolor empeora.  Tiene fiebre.  No defeca despus de 4das.  Vomita.  No tiene hambre.  Pierde peso.  Tiene una hemorragia en el ano.  Las heces son delgadas como un lpiz. Solicite ayuda de inmediato si:  Tiene fiebre y los sntomas empeoran repentinamente.  Observa que se filtran heces o hay sangre en las heces.  Tiene el abdomen distendido.  Siente un dolor intenso en el abdomen.  Se siente mareado o se desmaya. Esta informacin no tiene Theme park manager el consejo del mdico. Asegrese de hacerle al mdico cualquier pregunta que tenga. Document Released: 01/04/2008 Document Revised: 04/06/2017 Document Reviewed: 06/04/2016 Elsevier Interactive Patient Education  2018 [employer].

## 2018-03-30 NOTE — Progress Notes (Signed)
Subjective:  Patient ID: Michele Rogers, female    DOB: 11/13/1935  Age: 83 y.o. MRN: [id number]  CC: Establish Care   HPI Michele Rogers presents for establishment of care and follow-up of her hypertension, elevated cholesterol, GERD, dehydration, constipation.  Blood pressure is been controlled on her current regimen.  Cholesterol is been well controlled as well.  Labs obtained of cardiology from October were reviewed and seen is normal.  She has ongoing issues with constipation.  She does not like to drink water or other fluids.  She has never had a DEXA scan.  She lives with her daughter and her husband.  She is here today with her granddaughter and an interpreter.  She has not had a heart attack or stroke.  She does have a history of tachycardia that is been well controlled with her metoprolol.  She exercises daily by walking.  History Michele Rogers has a past medical history of Arthritis, Chest pain, Headache(784.0), Hyperlipidemia, and Hypertension.   She has a past surgical history that includes Abdominal hysterectomy; Cholecystectomy; Vaginal delivery; and Humerus fracture surgery.   Her family history includes Cancer in her sister.She reports that she has never smoked. She has never used smokeless tobacco. She reports that she drinks alcohol. She reports that she does not use drugs.  Outpatient Medications Prior to Visit  Medication Sig Dispense Refill  . atorvastatin (LIPITOR) 40 MG tablet TOME MEDIA TABLETA (1/2) POR VIA ORAL TODOS LOS DIAS 45 tablet 3  . docusate sodium (COLACE) 100 MG capsule Take 1 capsule (100 mg total) by mouth 2 (two) times daily. 60 capsule 0  . hydrochlorothiazide (HYDRODIURIL) 25 MG tablet TAKE 1 TABLET (25 MG TOTAL) BY MOUTH DAILY. 90 tablet 3  . loratadine (CLARITIN) 10 MG tablet Take 1 tablet (10 mg total) by mouth daily. 30 tablet 11  . losartan (COZAAR) 100 MG tablet TOME UNA TABLETA POR VIA ORAL A DIARIO 90 tablet 3  . metoprolol (LOPRESSOR) 100 MG  tablet Take 1 tablet (100 mg total) by mouth 2 (two) times daily. 180 tablet 3  . pantoprazole (PROTONIX) 40 MG tablet TAKE 1 TABLET (40 MG TOTAL) BY MOUTH DAILY. 30 tablet 3  . potassium chloride (KLOR-CON 10) 10 MEQ tablet TOME UNA TABLETA POR VIA ORAL TODOS LOS DIAS 90 tablet 3  . aspirin 81 MG tablet Take 81 mg by mouth daily.    . traMADol (ULTRAM) 50 MG tablet Take 1 tablet (50 mg total) by mouth every 8 (eight) hours as needed. 30 tablet 0   No facility-administered medications prior to visit.     ROS Review of Systems  Constitutional: Negative.   Eyes: Negative.   Respiratory: Negative.   Cardiovascular: Negative.   Gastrointestinal: Positive for constipation. Negative for anal bleeding and blood in stool.  Endocrine: Negative for cold intolerance and heat intolerance.  Genitourinary: Negative for difficulty urinating, frequency and hematuria.  Skin: Negative for pallor and rash.  Allergic/Immunologic: Negative for immunocompromised state.  Neurological: Negative for weakness and headaches.  Hematological: Does not bruise/bleed easily.  Psychiatric/Behavioral: Negative.     Objective:  BP 126/80 (BP Location: Right Arm, Patient Position: Sitting, Cuff Size: Normal)   Pulse 77   Ht 5\' 2"  (1.575 m)   Wt 135 lb 4 oz (61.3 kg)   SpO2 94%   BMI 24.74 kg/m   Physical Exam  Constitutional: She is oriented to person, place, and time. She appears well-developed and well-nourished. No distress.  HENT:  Head:  Normocephalic and atraumatic.  Right Ear: Tympanic membrane, external ear and ear canal normal.  Left Ear: Tympanic membrane, external ear and ear canal normal.  Mouth/Throat: Oropharynx is clear and moist. No oropharyngeal exudate.  Eyes: Pupils are equal, round, and reactive to light. Right eye exhibits no discharge. Left eye exhibits no discharge. No scleral icterus.  Neck: Neck supple. No JVD present. No tracheal deviation present. No thyromegaly present.    Cardiovascular: Normal rate, regular rhythm and normal heart sounds.  Pulmonary/Chest: Effort normal and breath sounds normal. No stridor. No respiratory distress. She has no wheezes. She has no rales.  Abdominal: Soft. Bowel sounds are normal. She exhibits no distension. There is no tenderness. There is no rebound and no guarding.  Lymphadenopathy:    She has no cervical adenopathy.  Neurological: She is alert and oriented to person, place, and time.  Skin: Skin is warm and dry. She is not diaphoretic.  Psychiatric: She has a normal mood and affect. Her behavior is normal.      Assessment & Plan:   Michele Rogers was seen today for establish care.  Diagnoses and all orders for this visit:  Essential hypertension -     CBC -     Comprehensive metabolic panel -     Urinalysis, Routine w reflex microscopic  Gastroesophageal reflux disease without esophagitis  Hyperlipidemia, unspecified hyperlipidemia type -     CBC -     Comprehensive metabolic panel -     Lipid panel  Tachycardia  Slow transit constipation -     CBC  Dehydration -     CBC -     Urinalysis, Routine w reflex microscopic  Vitamin D deficiency -     VITAMIN D 25 Hydroxy (Vit-D Deficiency, Fractures)  Estrogen deficiency -     DG Bone Density; Future   I have discontinued Michele Rogers. Michele Rogers's aspirin and traMADol. I am also having her maintain her loratadine, docusate sodium, metoprolol tartrate, hydrochlorothiazide, losartan, potassium chloride, atorvastatin, and pantoprazole.  No orders of the defined types were placed in this encounter.  1 ahead and discontinued patient's aspirin.  We had a 10-minute discussion about the relationship between chronic constipation and chronic dehydration.  Discussed using rate labored waters as a substitute for sugary drinks.  encouraged her to continue her exercise.  labs are being checked today and will be able to refill medicines as needed.  follow-up planned in 3  months.  Follow-up: Return in about 3 months (around 06/29/2018).  Mliss Sax, MD

## 2018-04-14 ENCOUNTER — Ambulatory Visit
Admission: RE | Admit: 2018-04-14 | Discharge: 2018-04-14 | Disposition: A | Discharge status: Home / Self Care | Payer: Medicaid Other | Source: Ambulatory Visit | Attending: Family Medicine | Admitting: Family Medicine

## 2018-04-14 DIAGNOSIS — E2839 Other primary ovarian failure: Secondary | ICD-10-CM | POA: Insufficient documentation

## 2018-04-14 DIAGNOSIS — Z780 Asymptomatic menopausal state: Secondary | ICD-10-CM | POA: Diagnosis not present

## 2018-04-14 DIAGNOSIS — M810 Age-related osteoporosis without current pathological fracture: Secondary | ICD-10-CM | POA: Diagnosis not present

## 2018-04-16 ENCOUNTER — Encounter: Payer: Self-pay | Admitting: Cardiovascular Disease

## 2018-04-24 ENCOUNTER — Other Ambulatory Visit: Payer: Self-pay | Admitting: Family Medicine

## 2018-04-26 NOTE — Telephone Encounter (Signed)
Patients provider is at [facility]

## 2018-04-27 ENCOUNTER — Ambulatory Visit (INDEPENDENT_AMBULATORY_CARE_PROVIDER_SITE_OTHER): Payer: Medicaid Other | Admitting: Cardiovascular Disease

## 2018-04-27 ENCOUNTER — Encounter: Payer: Self-pay | Admitting: Cardiovascular Disease

## 2018-04-27 VITALS — BP 124/68 | HR 61 | Ht 62.6 in | Wt 136.8 lb

## 2018-04-27 DIAGNOSIS — E782 Mixed hyperlipidemia: Secondary | ICD-10-CM

## 2018-04-27 DIAGNOSIS — I10 Essential (primary) hypertension: Secondary | ICD-10-CM

## 2018-04-27 NOTE — Progress Notes (Signed)
Cardiology Office Note   Date:  04/27/2018   ID:  Michele Rogers, DOB 1935/12/10, MRN [id number]  PCP:  Mliss Sax, MD  Cardiologist:   Kristeen Miss, MD   Problem list 1.  Essential hypertension 2.  Hyperlipidemia  Chief Complaint  Patient presents with  . Hypertension  . Hyperlipidemia    Michele Rogers is a 83 yo female from [place] who is seen today with her granddaughter. They are saddened today due to the death of her Michele Rogers ( granddaughter's brother) He was found dead in his bed yesterday.  She's done very well on the Losartan. Initially she was having some difficulty in getting Medicaid to improve the Losartan but she developed a cough with the lisinopril and they ultimately have approved the losartan.  Her blood pressures been doing well. She's been avoiding extra salt.  May 03, 2013:  Michele Rogers has had some dizziness back in February. The episodes have resolved.  She has not been exercising - is about to start.   August 10, 2012:  Michele Rogers is doing well. We prescribed Hydrochlorothiazide and potassium chloride during her last visit. She took both of these for one month and then never had the prescriptions refilled. She complains of some mild leg swelling.  She's scheduled to leave for [place] in September and will stay through January. She's not having any cardiac complaints.  Feb. 23, 2015:  Michele Rogers is doing OK. Occasional ankle edema and mild headaches.   August 23, 2014:  March 05, 2015:  Michele Rogers is a 83 y.o. female who presents for follow-up of her hypertension. Was seen with Michele Rogers  Sept. 28 , 2016 Pt was seen today with Michele Rogers , Intrepreter.  Feeling well, able to do all of her usual activities without any dyspnea or fatigue .   April 15, 2016:    Seen with Michele Rogers    Intrepreter.  Doing well,  Feeling well No CP  Not exercising any , does not enjoy it .   Walks on occasion   April 25. 2018  Michele Rogers  is seen - using the Spanish intrepreter video - stratus. Michele Rogers # [phone] and [name] [phone]) )  Having palpitations - fast HR  Last for 30 minutes or so  No syncope ,  No CP , no dyspnea Is leaving for [place] in 2-3 weeks   Oct. 5, 2018:  Is doing well Traveling back to [place] again soon  Walking a fair amount   April 27, 2018:  Seen with Michele Rogers ( intrepreter)  Feeling well.   Tolerating meds well No syncope  Small amount of pain in her chest ,  Last for 1 second, Not exertional   Past Medical History:  Diagnosis Date  . Arthritis   . Chest pain   . Headache(784.0)   . Hyperlipidemia   . Hypertension     Past Surgical History:  Procedure Laterality Date  . ABDOMINAL HYSTERECTOMY     complete  . CHOLECYSTECTOMY    . HUMERUS FRACTURE SURGERY     left arm  . VAGINAL DELIVERY     5     Current Outpatient Medications  Medication Sig Dispense Refill  . atorvastatin (LIPITOR) 40 MG tablet TOME MEDIA TABLETA (1/2) POR VIA ORAL TODOS LOS DIAS 45 tablet 3  . docusate sodium (COLACE) 100 MG capsule Take 1 capsule (100 mg total) by mouth 2 (two) times daily. 60 capsule 0  . hydrochlorothiazide (HYDRODIURIL) 25 MG  tablet TAKE 1 TABLET (25 MG TOTAL) BY MOUTH DAILY. 90 tablet 3  . loratadine (CLARITIN) 10 MG tablet Take 1 tablet (10 mg total) by mouth daily. 30 tablet 11  . losartan (COZAAR) 100 MG tablet TOME UNA TABLETA POR VIA ORAL A DIARIO 90 tablet 3  . metoprolol (LOPRESSOR) 100 MG tablet Take 1 tablet (100 mg total) by mouth 2 (two) times daily. 180 tablet 3  . pantoprazole (PROTONIX) 40 MG tablet TOME UNA TABLETA TODOS LOS DIAS 30 tablet 3  . potassium chloride (KLOR-CON 10) 10 MEQ tablet TOME UNA TABLETA POR VIA ORAL TODOS LOS DIAS 90 tablet 3   No current facility-administered medications for this visit.     Allergies:   Lisinopril    Social History:  The patient  reports that she has never smoked. She has never used smokeless tobacco. She reports  that she drinks alcohol. She reports that she does not use drugs.   Family History:  The patient's family history includes Cancer in her sister.    ROS: Noted in current history, otherwise review of systems is negative.    Physical Exam: Blood pressure 124/68, pulse 61, height 5' 2.6" (1.59 m), weight 136 lb 12.8 oz (62.1 kg), SpO2 96 %.  GEN:  Elderly female, NAD  HEENT: Normal NECK: No JVD; No carotid bruits LYMPHATICS: No lymphadenopathy CARDIAC: RRR   RESPIRATORY:  Clear to auscultation without rales, wheezing or rhonchi  ABDOMEN: Soft, non-tender, non-distended MUSCULOSKELETAL:  No edema; No deformity  SKIN: Warm and dry NEUROLOGIC:  Alert and oriented x 3    EKG: April 27, 2018: Normal sinus rhythm at 61.  Nonspecific ST and T wave abnormalities.  Recent Labs: 03/30/2018: ALT 18; BUN 17; Creatinine, Ser 0.77; Hemoglobin 13.5; Platelets 239.0; Potassium 4.4; Sodium 143    Lipid Panel    Component Value Date/Time   CHOL 146 03/30/2018 0936   CHOL 140 10/02/2017 1146   TRIG 138.0 03/30/2018 0936   HDL 52.70 03/30/2018 0936   HDL 52 10/02/2017 1146   CHOLHDL 3 03/30/2018 0936   VLDL 27.6 03/30/2018 0936   LDLCALC 66 03/30/2018 0936   LDLCALC 53 10/02/2017 1146   LDLDIRECT 81.1 05/30/2013 1642    Wt Readings from Last 3 Encounters:  04/27/18 136 lb 12.8 oz (62.1 kg)  03/30/18 135 lb 4 oz (61.3 kg)  10/02/17 136 lb (61.7 kg)      Other studies Reviewed: Additional studies/ records that were reviewed today include: . Review of the above records demonstrates:    ASSESSMENT AND PLAN:  1. Tachycardia:  Has resolved   2.. Essential hypertension: Blood pressure is well controlled on current medications.  Her labs look great.  3. Hyperlipidemia:    Stable.  Continue current medications.    Current medicines are reviewed at length with the patient today.  The patient does not have concerns regarding medicines.  The following changes have been made:  no  change   Disposition:   FU with me in 6 months     Signed, Kristeen Miss, MD  04/27/2018 11:57 AM    [facility] [address] [place], [place], [place]  [address] Phone: [phone]; Fax: [phone]

## 2018-04-27 NOTE — Patient Instructions (Signed)

## 2018-05-04 ENCOUNTER — Encounter: Payer: Self-pay | Admitting: Family Medicine

## 2018-05-04 ENCOUNTER — Ambulatory Visit: Payer: Medicaid Other | Admitting: Family Medicine

## 2018-05-04 VITALS — BP 138/78 | HR 102 | Ht 62.6 in | Wt 136.0 lb

## 2018-05-04 DIAGNOSIS — E559 Vitamin D deficiency, unspecified: Secondary | ICD-10-CM | POA: Diagnosis not present

## 2018-05-04 DIAGNOSIS — M810 Age-related osteoporosis without current pathological fracture: Secondary | ICD-10-CM | POA: Insufficient documentation

## 2018-05-04 MED ORDER — ALENDRONATE SODIUM 70 MG PO TABS: 70.0000 mg | ORAL_TABLET | ORAL | 11 refills | Status: DC

## 2018-05-04 MED ORDER — CALCIUM CARBONATE-VITAMIN D 500-200 MG-UNIT PO TABS: 1.0000 | ORAL_TABLET | Freq: Two times a day (BID) | ORAL | 6 refills | Status: DC

## 2018-05-04 NOTE — Progress Notes (Signed)
Subjective:  Patient ID: Michele Rogers, female    DOB: Jan 24, 1935  Age: 83 y.o. MRN: [id number]  CC: Follow-up (bone density)   HPI Michele Rogers presents for a discussion of the results of her DEXA scan.  She has known history of osteoporosis.  Vitamin D levels have been low normal.  She is not currently supplementing with calcium.  She has no history of a bone fracture that she is aware of.  She has upper and lower dentures.  She is accompanied today by her granddaughter and an interpreter.  Outpatient Medications Prior to Visit  Medication Sig Dispense Refill  . atorvastatin (LIPITOR) 40 MG tablet TOME MEDIA TABLETA (1/2) POR VIA ORAL TODOS LOS DIAS 45 tablet 3  . docusate sodium (COLACE) 100 MG capsule Take 1 capsule (100 mg total) by mouth 2 (two) times daily. 60 capsule 0  . hydrochlorothiazide (HYDRODIURIL) 25 MG tablet TAKE 1 TABLET (25 MG TOTAL) BY MOUTH DAILY. 90 tablet 3  . loratadine (CLARITIN) 10 MG tablet Take 1 tablet (10 mg total) by mouth daily. 30 tablet 11  . losartan (COZAAR) 100 MG tablet TOME UNA TABLETA POR VIA ORAL A DIARIO 90 tablet 3  . metoprolol (LOPRESSOR) 100 MG tablet Take 1 tablet (100 mg total) by mouth 2 (two) times daily. 180 tablet 3  . pantoprazole (PROTONIX) 40 MG tablet TOME UNA TABLETA TODOS LOS DIAS 30 tablet 3  . potassium chloride (KLOR-CON 10) 10 MEQ tablet TOME UNA TABLETA POR VIA ORAL TODOS LOS DIAS 90 tablet 3   No facility-administered medications prior to visit.     ROS Review of Systems  Constitutional: Negative.   HENT: Negative for dental problem.   Respiratory: Negative.   Cardiovascular: Negative.   Gastrointestinal: Negative.   Musculoskeletal: Negative for gait problem and joint swelling.  Neurological: Negative for weakness.  Psychiatric/Behavioral: Negative.     Objective:  BP 138/78   Pulse (!) 102   Ht 5' 2.6" (1.59 m)   Wt 136 lb (61.7 kg)   SpO2 97%   BMI 24.40 kg/m   BP Readings from Last 3 Encounters:    05/04/18 138/78  04/27/18 124/68  03/30/18 126/80    Wt Readings from Last 3 Encounters:  05/04/18 136 lb (61.7 kg)  04/27/18 136 lb 12.8 oz (62.1 kg)  03/30/18 135 lb 4 oz (61.3 kg)    Physical Exam  Constitutional: She is oriented to person, place, and time. She appears well-developed and well-nourished. No distress.  HENT:  Head: Normocephalic and atraumatic.  Right Ear: External ear normal.  Left Ear: External ear normal.  Eyes: Right eye exhibits no discharge. Left eye exhibits no discharge. No scleral icterus.  Neck: No JVD present. No tracheal deviation present.  Pulmonary/Chest: Effort normal.  Neurological: She is alert and oriented to person, place, and time.  Skin: Skin is warm and dry. She is not diaphoretic.  Psychiatric: She has a normal mood and affect. Her behavior is normal.    Lab Results  Component Value Date   WBC 6.0 03/30/2018   HGB 13.5 03/30/2018   HCT 40.2 03/30/2018   PLT 239.0 03/30/2018   GLUCOSE 108 (H) 03/30/2018   CHOL 146 03/30/2018   TRIG 138.0 03/30/2018   HDL 52.70 03/30/2018   LDLDIRECT 81.1 05/30/2013   LDLCALC 66 03/30/2018   ALT 18 03/30/2018   AST 22 03/30/2018   NA 143 03/30/2018   K 4.4 03/30/2018   CL 104 03/30/2018  CREATININE 0.77 03/30/2018   BUN 17 03/30/2018   CO2 31 03/30/2018   TSH 1.830 04/22/2017   INR 0.8 04/20/2012   HGBA1C 6.2 03/31/2017   MICROALBUR 0.1 05/30/2013    Dg Bone Density  Result Date: 04/14/2018 EXAM: DUAL X-RAY ABSORPTIOMETRY (DXA) FOR BONE MINERAL DENSITY IMPRESSION: Dear Dr. Mliss Sax, Your patient Michele Rogers completed a BMD test on 04/14/2018 using the Lunar iDXA DXA System (analysis version: 14.10) manufactured by [employer]. The following summarizes the results of our evaluation. PATIENT BIOGRAPHICAL: Name: Michele Rogers, Michele Rogers Patient ID:  [id number] Birth Date: 1935/02/07 Height:     60.2 in. Weight:     135.3 lbs. Gender:      Female Exam Date:  04/14/2018 Indications:  Advanced Age, History of Fracture (Adult), Hysterectomy, Postmenopausal Fractures: Left wrist Treatments: ASSESSMENT: The BMD measured at AP Spine L1-L4 is 0.767 g/cm2 with a T-score of -3.5. This patient is considered OSTEOPOROTIC according to World Health Organization [facility]) criteria. Site Region Measured Measured WHO Young Adult BMD Date       Age      Classification T-score AP Spine L1-L4 04/14/2018 80.4 Osteoporosis -3.5 0.767 g/cm2 DualFemur Neck Left 04/14/2018 80.4 Osteoporosis -2.8 0.648 g/cm2 World Health Organization [facility]) criteria for post-menopausal, Caucasian Women: Normal:       T-score at or above -1 SD Osteopenia:   T-score between -1 and -2.5 SD Osteoporosis: T-score at or below -2.5 SD RECOMMENDATIONS: 1. All patients should optimize calcium and vitamin D intake. 2. Consider FDA-approved medical therapies in postmenopausal women and men aged 9 years and older, based on the following: a. A hip or vertebral(clinical or morphometric) fracture b. T-score < -2.5 at the femoral neck or spine after appropriate evaluation to exclude secondary causes c. Low bone mass (T-score between -1.0 and -2.5 at the femoral neck or spine) and a 10-year probability of a hip fracture > 3% or a 10-year probability of a major osteoporosis-related fracture > 20% based on the [place]-adapted WHO algorithm d. Clinician judgment and/or patient preferences may indicate treatment for people with 10-year fracture probabilities above or below these levels FOLLOW-UP: People with diagnosed cases of osteoporosis or at high risk for fracture should have regular bone mineral density tests. For patients eligible for Medicare, routine testing is allowed once every 2 years. The testing frequency can be increased to one year for patients who have rapidly progressing disease, those who are receiving or discontinuing medical therapy to restore bone mass, or have additional risk factors. I have reviewed this report, and agree with the above  findings. Mark A. Tyron Russell, M.D. [facility] Electronically Signed   By: Ulyses Southward M.D.   On: 04/14/2018 13:27    Assessment & Plan:   Dniyah was seen today for follow-up.  Diagnoses and all orders for this visit:  Vitamin D deficiency -     calcium-vitamin D (OSCAL WITH D) 500-200 MG-UNIT tablet; Take 1 tablet by mouth 2 (two) times daily.  Age-related osteoporosis without current pathological fracture -     alendronate (FOSAMAX) 70 MG tablet; Take 1 tablet (70 mg total) by mouth once a week. Take with a full glass of water on an empty stomach. -     calcium-vitamin D (OSCAL WITH D) 500-200 MG-UNIT tablet; Take 1 tablet by mouth 2 (two) times daily.   I am having Michele Rogers start on alendronate and calcium-vitamin D. I am also having her maintain her loratadine, docusate sodium, metoprolol tartrate, hydrochlorothiazide, losartan,  potassium chloride, atorvastatin, and pantoprazole.  Meds ordered this encounter  Medications  . alendronate (FOSAMAX) 70 MG tablet    Sig: Take 1 tablet (70 mg total) by mouth once a week. Take with a full glass of water on an empty stomach.    Dispense:  4 tablet    Refill:  11  . calcium-vitamin D (OSCAL WITH D) 500-200 MG-UNIT tablet    Sig: Take 1 tablet by mouth 2 (two) times daily.    Dispense:  180 tablet    Refill:  6   Patient is aware of the importance of treating osteoporosis.  She was given anticipatory guidance in Spanish regarding osteoporosis and the use of Fosamax.  Reiterated the importance of taking the Fosamax and remaining in an upright position for 2 full hours.  She has upper and lower dentures.  Last vitamin D levels were in the normal range.  Have also asked her to take calcium with vitamin D twice a day.  Follow-up will be at our next scheduled clinic visit.  Follow-up: No follow-ups on file.  Mliss Sax, MD

## 2018-05-04 NOTE — Patient Instructions (Signed)
Osteoporosis Osteoporosis La osteoporosis es el debilitamiento y la prdida de la densidad sea. La osteoporosis hace que los huesos se vuelvan ms quebradizos, frgiles propensos a romperse (fractura). Con el tiempo, la osteoporosis puede hacer que los huesos se vuelvan tan dbiles que se fracturan con una simple cada. Los huesos ms propensos a las fracturas son los huesos de las caderas, las muecas y la columna vertebral. Cules son las causas? La causa exacta se desconoce. Qu incrementa el riesgo? Cualquier persona puede desarrollar osteoporosis. Puede correr un mayor riesgo si tiene antecedentes familiares de la enfermedad o desnutricin. Tambin puede tener un mayor riesgo si:  Mujeres.  Tiene 50 aos o ms.  Fuma.  No realiza actividad fsica.  Es caucsico o asitico.  Es delgado.  Cules son los signos o los sntomas? La fractura puede ser el primer signo de la enfermedad, en especial si se origina a causa de una cada o lesin que, por lo general, no provocara una fractura de hueso. Otros signos y sntomas incluyen los siguientes:  Dolor en la parte inferior de la espalda y el cuello.  Postura encorvada.  Prdida de la altura.  Cmo se diagnostica? Para realizar un diagnstico, el mdico puede hacer lo siguiente:  Magazine features editor historia clnica.  Realizar un examen fsico.  Location manager, como: ? Neomia Dear prueba de la densidad sea mineral. ? Una absorciometra con rayos X de doble energa.  Cmo se trata? El objetivo del tratamiento para la osteoporosis es fortalecer los [place], a fin de reducir el riesgo de fracturas. El tratamiento puede incluir lo siguiente:  Cambios de estilo de vida, como: ? Seguir una dieta con alto contenido de calcio. ? Hacer ejercicios de levantamiento de pesas y fortalecimiento muscular. ? Dejar de consumir tabaco. ? Limitar el consumo de bebidas alcohlicas.  Tomar medicamentos para retrasar el proceso de prdida sea o  aumentar la densidad sea.  Controlar los niveles de calcio y vitamina D.  Siga estas instrucciones en su casa:  Incluya calcio y vitamina D en su dieta. El calcio es importante para la salud sea, y la vitamina D ayuda al organismo a Loss adjuster, chartered calcio.  Realice ejercicios de levantamiento de pesas y fortalecimiento muscular como se lo haya indicado el mdico.  No consuma ningn producto que contenga tabaco, lo que incluye cigarrillos, tabaco de Theatre manager o Administrator, Civil Service. Si necesita ayuda para dejar de fumar, consulte al mdico.  Limite el consumo de bebidas alcohlicas.  Tome los medicamentos solamente como se lo haya indicado el mdico.  Concurra a todas las visitas de control como se lo haya indicado el mdico. Esto es importante.  Tome las precauciones necesarias en su casa para reducir el riesgo de cadas, como: ? Mantener las habitaciones bien iluminadas y libres de obstculos. ? Instalar barandas de seguridad en las escaleras. ? Usar tapetes de caucho en el bao y en otros sectores que a menudo estn mojadas o son resbaladizas. Solicite ayuda de inmediato si: Sufre una cada o lesin. Esta informacin no tiene Theme park manager el consejo del mdico. Asegrese de hacerle al mdico cualquier pregunta que tenga. Document Released: 09/24/2005 Document Revised: 03/20/2017 Document Reviewed: 05/25/2014 Elsevier Interactive Patient Education  2018 [employer]. Alendronate weekly tablets Qu es este medicamento? El ALENDRONATO reduce la prdida de calcio de los [place]. Este medicamento se utiliza para ayudar aumentar la produccin de hueso sano y prevenir la prdida de hueso en personas con osteoporosis. Tambin se [place] para tratar la enfermedad de [place].  Este medicamento puede ser utilizado para otros usos; si tiene alguna pregunta consulte con su proveedor de atencin mdica o con su farmacutico. MARCAS COMUNES: Fosamax Qu le debo informar a mi profesional de la  salud antes de tomar este medicamento? Necesitan saber si usted presenta alguno de los [address] o situaciones: problemas de esfago, [place], o intestino, tales como acidez o reflujo gastroesofgico enfermedad dental enfermedad renal bajo nivel de calcio en la sangre bajo nivel de vitamina D problemas para tragar problemas para sentarse o pararse durante 30 minutos una reaccin alrgica o inusual al alendronato, a otros medicamentos, alimentos, colorantes o conservantes si est embarazada o buscando quedar embarazada si est amamantando a un beb Cmo debo utilizar este medicamento? Debe tomar este medicamento exactamente segn las instrucciones o disminuir la cantidad de medicamento que absorba en el cuerpo y es posible que pueda sufrir daos. Tome su dosis por va oral en la maana, apenas se levante para Engineering geologist. No coma ni beba nada antes de tomar [employer]. Trague el medicamento con un vaso lleno (6 a 8 onzas lquidas) de agua sola. No tome esta tableta con ningn otro lquido. No mastique ni triture la tableta. Despus de tomar este medicamento, no coma su desayuno, beba ni tome ningn medicamento o vitamina durante al menos 30 minutos. Qudese sentado o parado durante al menos 30 minutos despus de tomar este medicamento; no se acueste. Tome este medicamento el mismo da cada semana. No tome su medicamento con una frecuencia mayor a la indicada. Hable con su pediatra para informarse acerca del uso de este medicamento en nios. Puede requerir atencin especial. Sobredosis: Pngase en contacto inmediatamente con un centro toxicolgico o una sala de urgencia si usted cree que haya tomado demasiado medicamento. ATENCIN: [employer] es solo para usted. No comparta este medicamento con nadie. Qu sucede si me olvido de una dosis? Si olvida tomar su dosis, tmela a la maana siguiente de haberlo recordado. Luego vuelva a tomar sus dosis en el da de la semana  originalmente. Nunca tome 2 tabletas el mismo da. No tome dosis adicionales o dobles. Qu puede interactuar con este medicamento? -hidrxido de aluminio -anticidos -aspirina -suplementos de calcio -medicamentos antiinflamatorios, tales como ibuprofeno, naproxeno, entre otros -suplementos con hierro -suplementos de magnesio -vitaminas con minerales Puede ser que esta lista no menciona todas las posibles interacciones. Informe a su profesional de [employer] de [employer] productos a base de hierbas, medicamentos de [place] [employer] o suplementos nutritivos que est tomando. Si usted fuma, consume bebidas alcohlicas o si utiliza drogas ilegales, indqueselo tambin a su profesional de [employer]. Algunas sustancias pueden interactuar con su medicamento. A qu debo estar atento al usar [employer]? Visite a su mdico o a su profesional de la salud para chequeos peridicos. Puede ser necesario que transcurra cierto tiempo antes de que pueda observar los beneficios de [place]. No deje de tomar su medicamento excepto si as lo indica su mdico. Su mdico o su profesional de la salud puede pedirle anlisis de sangre u otros examenes para International aid/development worker su evolucin. Asegurarse de que su dieta incluya la cantidad necesaria de calcio y vitamina D mientras est tomando este medicamento, a menos que su mdico le indica lo contrario. Hable con su profesional de la salud acerca de sus alimentos y las vitaminas que est tomando. Algunas personas que toman este medicamento experimentan dolor grave de [place], de articulaciones o/y de msculos. Este medicamento tambin puede aumentar el riesgo de un  fmur roto. Informe a su mdico inmediatamente si tiene dolor en la pierna superior o la ingle. Si experimenta dolor que no desaparece o que empeora, informe a su mdico. Este medicamento puede aumentar la sensibilidad al sol. Si desarrolla un sarpullido mientras est tomando [employer], el sarpullido puede empeorar  con la exposicin al sol. Mantngase fuera de Secretary/administrator. Si no lo puede evitar, utilice ropa protectora y crema de Orthoptist. No utilice lmparas solares, camas solares ni cabinas solares. Qu efectos secundarios puedo tener al [employer] este medicamento? Efectos secundarios que debe informar a su mdico o a Producer, television/film/video de la salud tan pronto como sea posible: -Therapist, art, tales como erupcin cutnea o picazn, urticarias, hinchazn de la cara, labios, lengua o garganta -heces de color oscuro o con aspecto alquitranado -dolores seos, musculares o articulares -cambios en la visin -dolor en el pecho -dolor o acidez de estmago -dolor de [place], especialmente despus de tratamiento dental -dolor o dificultad para tragar -enrojecimiento, formacin de ampollas, descamacin o distensin de la piel, inclusive dentro de la boca Efectos secundarios que, por lo general, no requieren atencin mdica (debe informarlos a su mdico o a su profesional de la salud si persisten o si son molestos): -cambios en el sentido del gusto -diarrea o estreimiento -dolor ocular o picazn -dolor de cabeza -nuseas, vmito -sensacin de saciedad o gases en el estmago Puede ser que esta lista no menciona todos los posibles efectos secundarios. Comunquese a su mdico por asesoramiento mdico [employer]. Usted puede informar los efectos secundarios a la FDA por telfono al 1-800-FDA-1088. Dnde debo guardar mi medicina? Mantngala fuera del alcance de los nios. Gurdela a temperatura ambiente de entre 15 y 30 grados C (12 y 3 grados F). Deseche todo el medicamento que no haya utilizado, despus de la fecha de vencimiento. ATENCIN: Este folleto es un resumen. Puede ser que no cubra toda la posible informacin. Si usted tiene preguntas acerca de esta medicina, consulte con su mdico, su farmacutico o su profesional de Radiographer, therapeutic.  2018 Elsevier/Gold Standard (2017-01-15  00:00:00)

## 2018-06-02 ENCOUNTER — Other Ambulatory Visit: Payer: Self-pay | Admitting: Family Medicine

## 2018-06-02 ENCOUNTER — Other Ambulatory Visit: Payer: Self-pay | Admitting: Cardiovascular Disease

## 2018-06-07 ENCOUNTER — Other Ambulatory Visit: Payer: Self-pay | Admitting: Family Medicine

## 2018-06-14 ENCOUNTER — Other Ambulatory Visit: Payer: Self-pay | Admitting: Family Medicine

## 2018-06-14 NOTE — Telephone Encounter (Signed)
Copied from CRM [id number]. Topic: Quick Communication - See Telephone Encounter >> Jun 14, 2018  2:10 PM Waymon AmatoBurton, Donna F wrote: Pt is needing a refill on hydrochlorothiazide , potassium, and losartan  [facility]   Best number [phone]

## 2018-06-15 MED ORDER — HYDROCHLOROTHIAZIDE 25 MG PO TABS: ORAL_TABLET | ORAL | 1 refills | Status: DC

## 2018-06-15 MED ORDER — LOSARTAN POTASSIUM 100 MG PO TABS: ORAL_TABLET | ORAL | 1 refills | Status: DC

## 2018-06-15 MED ORDER — POTASSIUM CHLORIDE ER 10 MEQ PO TBCR: EXTENDED_RELEASE_TABLET | ORAL | 1 refills | Status: DC

## 2018-06-15 NOTE — Telephone Encounter (Signed)
Patient needs refills on historical medications from former PCP: losartan, hydrodiuril, and potassium chloride.  LOV: 03/30/18-est care          05/04/18-f/u  PCP: Doreene BurkeKremer  Pharmacy: verified

## 2018-06-29 ENCOUNTER — Ambulatory Visit: Payer: Medicaid Other | Admitting: Family Medicine

## 2018-07-06 ENCOUNTER — Ambulatory Visit: Payer: Medicaid Other | Admitting: Family Medicine

## 2018-07-15 ENCOUNTER — Ambulatory Visit: Payer: Medicaid Other | Admitting: Family Medicine

## 2018-07-15 ENCOUNTER — Encounter: Payer: Self-pay | Admitting: Family Medicine

## 2018-07-15 VITALS — BP 122/78 | HR 88 | Temp 97.8°F | Ht 62.6 in | Wt 140.5 lb

## 2018-07-15 DIAGNOSIS — K219 Gastro-esophageal reflux disease without esophagitis: Secondary | ICD-10-CM | POA: Diagnosis not present

## 2018-07-15 DIAGNOSIS — E785 Hyperlipidemia, unspecified: Secondary | ICD-10-CM | POA: Diagnosis not present

## 2018-07-15 DIAGNOSIS — I10 Essential (primary) hypertension: Secondary | ICD-10-CM | POA: Diagnosis not present

## 2018-07-15 DIAGNOSIS — G250 Essential tremor: Secondary | ICD-10-CM | POA: Diagnosis not present

## 2018-07-15 DIAGNOSIS — M810 Age-related osteoporosis without current pathological fracture: Secondary | ICD-10-CM | POA: Diagnosis not present

## 2018-07-15 LAB — BASIC METABOLIC PANEL
BUN: 13 mg/dL (ref 6–23)
CALCIUM: 9.3 mg/dL (ref 8.4–10.5)
CO2: 32 meq/L (ref 19–32)
CREATININE: 0.87 mg/dL (ref 0.40–1.20)
Chloride: 102 mEq/L (ref 96–112)
GFR: 66.47 mL/min (ref >60.00)
Glucose, Bld: 136 mg/dL — ABNORMAL HIGH (ref 70–99)
Potassium: 4.6 mEq/L (ref 3.5–5.1)
Sodium: 140 mEq/L (ref 135–145)

## 2018-07-15 MED ORDER — HYDROCHLOROTHIAZIDE 25 MG PO TABS: ORAL_TABLET | ORAL | 1 refills | Status: DC

## 2018-07-15 MED ORDER — METOPROLOL TARTRATE 100 MG PO TABS: 100.0000 mg | ORAL_TABLET | Freq: Two times a day (BID) | ORAL | 3 refills | Status: DC

## 2018-07-15 MED ORDER — LOSARTAN POTASSIUM 100 MG PO TABS: ORAL_TABLET | ORAL | 1 refills | Status: DC

## 2018-07-15 MED ORDER — PANTOPRAZOLE SODIUM 40 MG PO TBEC: 40.0000 mg | DELAYED_RELEASE_TABLET | Freq: Every day | ORAL | 3 refills | Status: DC

## 2018-07-15 MED ORDER — OS-CAL CALCIUM + D3 500-200 MG-UNIT PO TABS: ORAL_TABLET | ORAL | 6 refills | Status: DC

## 2018-07-15 MED ORDER — ATORVASTATIN CALCIUM 40 MG PO TABS: 40.0000 mg | ORAL_TABLET | Freq: Every day | ORAL | 3 refills | Status: DC

## 2018-07-15 MED ORDER — ALENDRONATE SODIUM 70 MG PO TABS: 70.0000 mg | ORAL_TABLET | ORAL | 11 refills | Status: DC

## 2018-07-15 MED ORDER — POTASSIUM CHLORIDE ER 10 MEQ PO TBCR: EXTENDED_RELEASE_TABLET | ORAL | 1 refills | Status: DC

## 2018-07-15 NOTE — Progress Notes (Signed)
Subjective:  Patient ID: Michele Rogers, female    DOB: [birth year]  Age: 83 y.o. MRN: [id number]  CC: Follow-up   HPI Michele Rogers presents for follow-up with her granddaughter and Nurse, learning disability.  Her blood pressure is well controlled with the hydrochlorothiazide losartan and metoprolol.  She is having no problems with these medicines.  Explained that she is also taking metoprolol for her benign head tremor.  Reflux is controlled with her Protonix.  She is taking the Fosamax weekly and the Os-Cal twice daily.  Os-Cal pills are large and somewhat difficult to swallow and she asks if she can break them half and I said that would be okay.  She is taking the atorvastatin for her elevated cholesterol.  She does complain of some irritation of her tongue and wonders if it is from taking too many medicines.  Outpatient Medications Prior to Visit  Medication Sig Dispense Refill  . docusate sodium (COLACE) 100 MG capsule Take 1 capsule (100 mg total) by mouth 2 (two) times daily. 60 capsule 0  . loratadine (CLARITIN) 10 MG tablet Take 1 tablet (10 mg total) by mouth daily. 30 tablet 11  . alendronate (FOSAMAX) 70 MG tablet Take 1 tablet (70 mg total) by mouth once a week. Take with a full glass of water on an empty stomach. 4 tablet 11  . atorvastatin (LIPITOR) 40 MG tablet TOME MEDIA TABLETA (1/2) POR VIA ORAL TODOS LOS DIAS 45 tablet 3  . hydrochlorothiazide (HYDRODIURIL) 25 MG tablet TAKE 1 TABLET (25 MG TOTAL) BY MOUTH DAILY. 90 tablet 1  . losartan (COZAAR) 100 MG tablet TOME UNA TABLETA POR VIA ORAL A DIARIO 90 tablet 1  . metoprolol tartrate (LOPRESSOR) 100 MG tablet TAKE 1 TABLET (100 MG TOTAL) BY MOUTH 2 (TWO) TIMES DAILY. 180 tablet 3  . OS-CAL CALCIUM + D3 500-200 MG-UNIT TABS TOME UNA TABLETA DOS VECES AL D?A  6  . pantoprazole (PROTONIX) 40 MG tablet TOME UNA TABLETA TODOS LOS DIAS 30 tablet 3  . potassium chloride (KLOR-CON 10) 10 MEQ tablet TOME UNA TABLETA POR VIA ORAL TODOS LOS DIAS  90 tablet 1  . calcium-vitamin D (OSCAL WITH D) 500-200 MG-UNIT tablet Take 1 tablet by mouth 2 (two) times daily. 180 tablet 6   No facility-administered medications prior to visit.     ROS Review of Systems  Constitutional: Negative.  Negative for chills, fatigue, fever and unexpected weight change.  HENT: Negative.   Eyes: Negative for photophobia and visual disturbance.  Respiratory: Negative for chest tightness, shortness of breath and wheezing.   Cardiovascular: Negative for chest pain and palpitations.  Gastrointestinal: Negative.   Endocrine: Negative for polyphagia and polyuria.  Genitourinary: Negative for decreased urine volume and frequency.  Musculoskeletal: Negative for gait problem and joint swelling.  Skin: Negative for pallor and rash.  Allergic/Immunologic: Negative for immunocompromised state.  Neurological: Positive for tremors. Negative for light-headedness and headaches.  Hematological: Does not bruise/bleed easily.  Psychiatric/Behavioral: Negative.     Objective:  BP 122/78   Pulse 88   Temp 97.8 F (36.6 C)   Ht 5' 2.6" (1.59 m)   Wt 140 lb 8 oz (63.7 kg)   SpO2 98%   BMI 25.21 kg/m   BP Readings from Last 3 Encounters:  07/15/18 122/78  05/04/18 138/78  04/27/18 124/68    Wt Readings from Last 3 Encounters:  07/15/18 140 lb 8 oz (63.7 kg)  05/04/18 136 lb (61.7 kg)  04/27/18 136 lb  12.8 oz (62.1 kg)    Physical Exam  Constitutional: She is oriented to person, place, and time. She appears well-developed and well-nourished. No distress.  HENT:  Head: Normocephalic and atraumatic.  Right Ear: External ear normal.  Left Ear: External ear normal.  Mouth/Throat: Oropharynx is clear and moist. No oropharyngeal exudate.    Eyes: Pupils are equal, round, and reactive to light. Conjunctivae and EOM are normal. Right eye exhibits no discharge. Left eye exhibits no discharge. No scleral icterus.  Neck: Normal range of motion. Neck supple. No JVD  present. No tracheal deviation present. No thyromegaly present.  Cardiovascular: Normal rate, regular rhythm and normal heart sounds.  Pulmonary/Chest: Effort normal and breath sounds normal.  Abdominal: Bowel sounds are normal.  Lymphadenopathy:    She has no cervical adenopathy.  Neurological: She is alert and oriented to person, place, and time.  Skin: Skin is warm and dry. She is not diaphoretic.  Psychiatric: She has a normal mood and affect. Her behavior is normal.    Lab Results  Component Value Date   WBC 6.0 03/30/2018   HGB 13.5 03/30/2018   HCT 40.2 03/30/2018   PLT 239.0 03/30/2018   GLUCOSE 108 (H) 03/30/2018   CHOL 146 03/30/2018   TRIG 138.0 03/30/2018   HDL 52.70 03/30/2018   LDLDIRECT 81.1 05/30/2013   LDLCALC 66 03/30/2018   ALT 18 03/30/2018   AST 22 03/30/2018   NA 143 03/30/2018   K 4.4 03/30/2018   CL 104 03/30/2018   CREATININE 0.77 03/30/2018   BUN 17 03/30/2018   CO2 31 03/30/2018   TSH 1.830 04/22/2017   INR 0.8 04/20/2012   HGBA1C 6.2 03/31/2017   MICROALBUR 0.1 05/30/2013    Dg Bone Density  Result Date: 04/14/2018 EXAM: DUAL X-RAY ABSORPTIOMETRY (DXA) FOR BONE MINERAL DENSITY IMPRESSION: Dear Dr. Mliss Sax, Your patient Michele Rogers completed a BMD test on 04/14/2018 using the Lunar iDXA DXA System (analysis version: 14.10) manufactured by [employer]. The following summarizes the results of our evaluation. PATIENT BIOGRAPHICAL: Name: Michele, Rogers Patient ID:  [id number] Birth Date: October 26, 1935 Height:     60.2 in. Weight:     135.3 lbs. Gender:      Female Exam Date:  04/14/2018 Indications: Advanced Age, History of Fracture (Adult), Hysterectomy, Postmenopausal Fractures: Left wrist Treatments: ASSESSMENT: The BMD measured at AP Spine L1-L4 is 0.767 g/cm2 with a T-score of -3.5. This patient is considered OSTEOPOROTIC according to World Health Organization [facility]) criteria. Site Region Measured Measured WHO Young Adult BMD Date        Age      Classification T-score AP Spine L1-L4 04/14/2018 80.4 Osteoporosis -3.5 0.767 g/cm2 DualFemur Neck Left 04/14/2018 80.4 Osteoporosis -2.8 0.648 g/cm2 World Health Organization [facility]) criteria for post-menopausal, Caucasian Women: Normal:       T-score at or above -1 SD Osteopenia:   T-score between -1 and -2.5 SD Osteoporosis: T-score at or below -2.5 SD RECOMMENDATIONS: 1. All patients should optimize calcium and vitamin D intake. 2. Consider FDA-approved medical therapies in postmenopausal women and men aged 81 years and older, based on the following: a. A hip or vertebral(clinical or morphometric) fracture b. T-score < -2.5 at the femoral neck or spine after appropriate evaluation to exclude secondary causes c. Low bone mass (T-score between -1.0 and -2.5 at the femoral neck or spine) and a 10-year probability of a hip fracture > 3% or a 10-year probability of a major osteoporosis-related fracture > 20%  based on the [place]-adapted WHO algorithm d. Clinician judgment and/or patient preferences may indicate treatment for people with 10-year fracture probabilities above or below these levels FOLLOW-UP: People with diagnosed cases of osteoporosis or at high risk for fracture should have regular bone mineral density tests. For patients eligible for Medicare, routine testing is allowed once every 2 years. The testing frequency can be increased to one year for patients who have rapidly progressing disease, those who are receiving or discontinuing medical therapy to restore bone mass, or have additional risk factors. I have reviewed this report, and agree with the above findings. Mark A. Tyron RussellBoles, M.D. [facility] Electronically Signed   By: Ulyses SouthwardMark  Boles M.D.   On: 04/14/2018 13:27    Assessment & Plan:   Michele Rogers was seen today for follow-up.  Diagnoses and all orders for this visit:  Essential hypertension -     hydrochlorothiazide (HYDRODIURIL) 25 MG tablet; TAKE 1 TABLET (25 MG TOTAL) BY MOUTH  DAILY. -     losartan (COZAAR) 100 MG tablet; TOME UNA TABLETA POR VIA ORAL A DIARIO -     metoprolol tartrate (LOPRESSOR) 100 MG tablet; Take 1 tablet (100 mg total) by mouth 2 (two) times daily. -     potassium chloride (KLOR-CON 10) 10 MEQ tablet; TOME UNA TABLETA POR VIA ORAL TODOS LOS DIAS -     Basic metabolic panel  Gastroesophageal reflux disease without esophagitis -     pantoprazole (PROTONIX) 40 MG tablet; Take 1 tablet (40 mg total) by mouth daily.  Benign head tremor -     metoprolol tartrate (LOPRESSOR) 100 MG tablet; Take 1 tablet (100 mg total) by mouth 2 (two) times daily.  Age-related osteoporosis without current pathological fracture -     alendronate (FOSAMAX) 70 MG tablet; Take 1 tablet (70 mg total) by mouth once a week. Take with a full glass of water on an empty stomach. -     OS-CAL CALCIUM + D3 500-200 MG-UNIT TABS; TOME UNA TABLETA DOS VECES AL D?A  Hyperlipidemia, unspecified hyperlipidemia type -     atorvastatin (LIPITOR) 40 MG tablet; Take 1 tablet (40 mg total) by mouth daily at 6 PM.   I have discontinued Johney MaineMaria O. Mcgloin's calcium-vitamin D. I have also changed her atorvastatin and pantoprazole. Additionally, I am having her maintain her loratadine, docusate sodium, alendronate, hydrochlorothiazide, losartan, metoprolol tartrate, OS-CAL CALCIUM + D3, and potassium chloride.  Meds ordered this encounter  Medications  . alendronate (FOSAMAX) 70 MG tablet    Sig: Take 1 tablet (70 mg total) by mouth once a week. Take with a full glass of water on an empty stomach.    Dispense:  4 tablet    Refill:  11  . atorvastatin (LIPITOR) 40 MG tablet    Sig: Take 1 tablet (40 mg total) by mouth daily at 6 PM.    Dispense:  90 tablet    Refill:  3  . hydrochlorothiazide (HYDRODIURIL) 25 MG tablet    Sig: TAKE 1 TABLET (25 MG TOTAL) BY MOUTH DAILY.    Dispense:  90 tablet    Refill:  1  . losartan (COZAAR) 100 MG tablet    Sig: TOME UNA TABLETA POR VIA ORAL A  DIARIO    Dispense:  90 tablet    Refill:  1    Please keep upcoming appointment for future refills. Thank you  . metoprolol tartrate (LOPRESSOR) 100 MG tablet    Sig: Take 1 tablet (100 mg total)  by mouth 2 (two) times daily.    Dispense:  180 tablet    Refill:  3  . OS-CAL CALCIUM + D3 500-200 MG-UNIT TABS    Sig: TOME UNA TABLETA DOS VECES AL D?A    Dispense:  60 tablet    Refill:  6  . pantoprazole (PROTONIX) 40 MG tablet    Sig: Take 1 tablet (40 mg total) by mouth daily.    Dispense:  90 tablet    Refill:  3  . potassium chloride (KLOR-CON 10) 10 MEQ tablet    Sig: TOME UNA TABLETA POR VIA ORAL TODOS LOS DIAS    Dispense:  90 tablet    Refill:  1   With the translator's help we week we discussed each medicine that she is taking and the reason for taking it.  She will break the Os-Cal and half.  She is traveling out of the country.  She will follow-up in 6 months.  Follow-up: Return in about 6 months (around 01/15/2019).  Mliss Sax, MD

## 2018-09-16 ENCOUNTER — Ambulatory Visit: Payer: Medicaid Other | Admitting: Family Medicine

## 2018-09-21 ENCOUNTER — Ambulatory Visit: Payer: Medicaid Other | Admitting: Family Medicine

## 2019-01-18 ENCOUNTER — Ambulatory Visit: Payer: Medicaid Other | Admitting: Family Medicine

## 2019-01-25 ENCOUNTER — Ambulatory Visit: Payer: Medicaid Other | Admitting: Family Medicine

## 2019-02-08 ENCOUNTER — Ambulatory Visit: Payer: Medicaid Other | Admitting: Family Medicine

## 2019-02-08 ENCOUNTER — Encounter: Payer: Self-pay | Admitting: Family Medicine

## 2019-02-08 VITALS — BP 128/70 | HR 96 | Ht 62.6 in | Wt 140.4 lb

## 2019-02-08 DIAGNOSIS — E785 Hyperlipidemia, unspecified: Secondary | ICD-10-CM | POA: Diagnosis not present

## 2019-02-08 DIAGNOSIS — I10 Essential (primary) hypertension: Secondary | ICD-10-CM | POA: Diagnosis not present

## 2019-02-08 DIAGNOSIS — K219 Gastro-esophageal reflux disease without esophagitis: Secondary | ICD-10-CM | POA: Diagnosis not present

## 2019-02-08 DIAGNOSIS — G250 Essential tremor: Secondary | ICD-10-CM

## 2019-02-08 DIAGNOSIS — M810 Age-related osteoporosis without current pathological fracture: Secondary | ICD-10-CM

## 2019-02-08 LAB — URINALYSIS, ROUTINE W REFLEX MICROSCOPIC
Bilirubin Urine: NEGATIVE
Ketones, ur: NEGATIVE
Leukocytes,Ua: NEGATIVE
Nitrite: NEGATIVE
RBC / HPF: NONE SEEN (ref 0–?)
Specific Gravity, Urine: <=1.005 — AB (ref 1.000–1.030)
Total Protein, Urine: NEGATIVE
Urine Glucose: NEGATIVE
Urobilinogen, UA: 0.2 (ref 0.0–1.0)
pH: 7.5 (ref 5.0–8.0)

## 2019-02-08 LAB — HEPATIC FUNCTION PANEL
ALBUMIN: 4 g/dL (ref 3.5–5.2)
ALT: 15 U/L (ref 0–35)
AST: 21 U/L (ref 0–37)
Alkaline Phosphatase: 78 U/L (ref 39–117)
Bilirubin, Direct: 0.2 mg/dL (ref 0.0–0.3)
Total Bilirubin: 1.4 mg/dL — ABNORMAL HIGH (ref 0.2–1.2)
Total Protein: 7.6 g/dL (ref 6.0–8.3)

## 2019-02-08 LAB — MICROALBUMIN / CREATININE URINE RATIO
Creatinine,U: 54.6 mg/dL
Microalb Creat Ratio: 1.3 mg/g (ref 0.0–30.0)
Microalb, Ur: <0.7 mg/dL (ref 0.0–1.9)

## 2019-02-08 LAB — BASIC METABOLIC PANEL
BUN: 11 mg/dL (ref 6–23)
CO2: 33 mEq/L — ABNORMAL HIGH (ref 19–32)
Calcium: 9.3 mg/dL (ref 8.4–10.5)
Chloride: 97 mEq/L (ref 96–112)
Creatinine, Ser: 0.84 mg/dL (ref 0.40–1.20)
GFR: 65.03 mL/min (ref >60.00)
Glucose, Bld: 114 mg/dL — ABNORMAL HIGH (ref 70–99)
Potassium: 4.3 mEq/L (ref 3.5–5.1)
SODIUM: 137 meq/L (ref 135–145)

## 2019-02-08 LAB — LDL CHOLESTEROL, DIRECT: LDL DIRECT: 58 mg/dL

## 2019-02-08 MED ORDER — LOSARTAN POTASSIUM 100 MG PO TABS: ORAL_TABLET | ORAL | 1 refills | Status: DC

## 2019-02-08 MED ORDER — ATORVASTATIN CALCIUM 40 MG PO TABS: 40.0000 mg | ORAL_TABLET | Freq: Every day | ORAL | 3 refills | Status: DC

## 2019-02-08 MED ORDER — METOPROLOL TARTRATE 100 MG PO TABS: 100.0000 mg | ORAL_TABLET | Freq: Two times a day (BID) | ORAL | 3 refills | Status: DC

## 2019-02-08 MED ORDER — POTASSIUM CHLORIDE ER 10 MEQ PO TBCR: EXTENDED_RELEASE_TABLET | ORAL | 1 refills | Status: DC

## 2019-02-08 MED ORDER — OS-CAL CALCIUM + D3 500-200 MG-UNIT PO TABS: ORAL_TABLET | ORAL | 6 refills | Status: AC

## 2019-02-08 MED ORDER — PANTOPRAZOLE SODIUM 40 MG PO TBEC: 40.0000 mg | DELAYED_RELEASE_TABLET | Freq: Every day | ORAL | 3 refills | Status: AC

## 2019-02-08 MED ORDER — HYDROCHLOROTHIAZIDE 25 MG PO TABS: ORAL_TABLET | ORAL | 1 refills | Status: DC

## 2019-02-08 MED ORDER — ALENDRONATE SODIUM 70 MG PO TABS: 70.0000 mg | ORAL_TABLET | ORAL | 11 refills | Status: DC

## 2019-02-08 NOTE — Progress Notes (Signed)
Established Patient Office Visit  Subjective:  Patient ID: Michele Rogers, female    DOB: 08/04/1935  Age: 84 y.o. MRN: [id number]  CC:  Chief Complaint  Patient presents with  . Follow-up    HPI Michele ParkMaria O Hogans presents for follow-up of her hypertension reflux elevated cholesterol and osteoporosis.  Pressure is been well controlled with metoprolol and HCTZ.  Continues to take her atorvastatin without issue.  Reflux is controlled with Protonix.  She is taking her Os-Cal twice daily along with the once weekly Fosamax.  She sleeps on one pillow and denies shortness of breath chest pain or lower extremity edema.  Past Medical History:  Diagnosis Date  . Arthritis   . Chest pain   . Headache(784.0)   . Hyperlipidemia   . Hypertension     Past Surgical History:  Procedure Laterality Date  . ABDOMINAL HYSTERECTOMY     complete  . CHOLECYSTECTOMY    . HUMERUS FRACTURE SURGERY     left arm  . VAGINAL DELIVERY     5    Family History  Problem Relation Age of Onset  . Cancer Sister        colon    Social History   Socioeconomic History  . Marital status: Widowed    Spouse name: Not on file  . Number of children: Not on file  . Years of education: Not on file  . Highest education level: Not on file  Occupational History  . Not on file  Social Needs  . Financial resource strain: Not on file  . Food insecurity:    Worry: Not on file    Inability: Not on file  . Transportation needs:    Medical: Not on file    Non-medical: Not on file  Tobacco Use  . Smoking status: Never Smoker  . Smokeless tobacco: Never Used  Substance and Sexual Activity  . Alcohol use: Yes    Alcohol/week: 0.0 standard drinks    Comment: one drink every couple months  . Drug use: No  . Sexual activity: Not on file  Lifestyle  . Physical activity:    Days per week: Not on file    Minutes per session: Not on file  . Stress: Not on file  Relationships  . Social connections:    Talks  on phone: Not on file    Gets together: Not on file    Attends religious service: Not on file    Active member of club or organization: Not on file    Attends meetings of clubs or organizations: Not on file    Relationship status: Not on file  . Intimate partner violence:    Fear of current or ex partner: Not on file    Emotionally abused: Not on file    Physically abused: Not on file    Forced sexual activity: Not on file  Other Topics Concern  . Not on file  Social History Narrative   [place] in [place], [place]. Lives with daughter, granddaughter. No pets.     Outpatient Medications Prior to Visit  Medication Sig Dispense Refill  . docusate sodium (COLACE) 100 MG capsule Take 1 capsule (100 mg total) by mouth 2 (two) times daily. 60 capsule 0  . loratadine (CLARITIN) 10 MG tablet Take 1 tablet (10 mg total) by mouth daily. 30 tablet 11  . alendronate (FOSAMAX) 70 MG tablet Take 1 tablet (70 mg total) by mouth once a week. Take with a full  glass of water on an empty stomach. 4 tablet 11  . hydrochlorothiazide (HYDRODIURIL) 25 MG tablet TAKE 1 TABLET (25 MG TOTAL) BY MOUTH DAILY. 90 tablet 1  . losartan (COZAAR) 100 MG tablet TOME UNA TABLETA POR VIA ORAL A DIARIO 90 tablet 1  . metoprolol tartrate (LOPRESSOR) 100 MG tablet Take 1 tablet (100 mg total) by mouth 2 (two) times daily. 180 tablet 3  . OS-CAL CALCIUM + D3 500-200 MG-UNIT TABS TOME UNA TABLETA DOS VECES AL D?A 60 tablet 6  . pantoprazole (PROTONIX) 40 MG tablet Take 1 tablet (40 mg total) by mouth daily. 90 tablet 3  . potassium chloride (KLOR-CON 10) 10 MEQ tablet TOME UNA TABLETA POR VIA ORAL TODOS LOS DIAS 90 tablet 1  . atorvastatin (LIPITOR) 40 MG tablet Take 1 tablet (40 mg total) by mouth daily at 6 PM. 90 tablet 3   No facility-administered medications prior to visit.     Allergies  Allergen Reactions  . Lisinopril Cough    ROS Review of Systems  Constitutional: Negative.   HENT: Negative.   Eyes:  Negative for photophobia and visual disturbance.  Respiratory: Negative.   Cardiovascular: Negative.   Gastrointestinal: Negative.   Endocrine: Negative for polyphagia and polyuria.  Musculoskeletal: Negative for gait problem and joint swelling.  Skin: Negative for pallor and rash.  Allergic/Immunologic: Negative for immunocompromised state.  Neurological: Negative for light-headedness and headaches.  Hematological: Does not bruise/bleed easily.  Psychiatric/Behavioral: Negative.       Objective:    Physical Exam  Constitutional: She is oriented to person, place, and time. She appears well-developed and well-nourished. No distress.  HENT:  Head: Normocephalic and atraumatic.  Right Ear: External ear normal.  Left Ear: External ear normal.  Mouth/Throat: Oropharynx is clear and moist. No oropharyngeal exudate.  Eyes: Pupils are equal, round, and reactive to light. Conjunctivae are normal. Right eye exhibits no discharge. Left eye exhibits no discharge. No scleral icterus.  Neck: Neck supple. No JVD present. No tracheal deviation present. No thyromegaly present.  Cardiovascular: Normal rate, regular rhythm and normal heart sounds.  Pulmonary/Chest: Effort normal and breath sounds normal. No stridor.  Abdominal: Bowel sounds are normal.  Musculoskeletal:        General: No edema.  Lymphadenopathy:    She has no cervical adenopathy.  Neurological: She is alert and oriented to person, place, and time.  Skin: Skin is warm and dry. She is not diaphoretic.  Psychiatric: She has a normal mood and affect. Her behavior is normal.    BP 128/70   Pulse 96   Ht 5' 2.6" (1.59 m)   Wt 140 lb 6 oz (63.7 kg)   SpO2 97%   BMI 25.19 kg/m  Wt Readings from Last 3 Encounters:  02/08/19 140 lb 6 oz (63.7 kg)  07/15/18 140 lb 8 oz (63.7 kg)  05/04/18 136 lb (61.7 kg)   BP Readings from Last 3 Encounters:  02/08/19 128/70  07/15/18 122/78  05/04/18 138/78   Guideline developer:  UpToDate  (see UpToDate for funding source) Date Released: June 2014  Health Maintenance Due  Topic Date Due  . Janet BerlinETANUS/TDAP  11/06/1956  . PNA vac Low Risk Adult (1 of 2 - PCV13) 11/06/2002    There are no preventive care reminders to display for this patient.  Lab Results  Component Value Date   TSH 1.830 04/22/2017   Lab Results  Component Value Date   WBC 6.0 03/30/2018   HGB 13.5 03/30/2018  HCT 40.2 03/30/2018   MCV 98.3 03/30/2018   PLT 239.0 03/30/2018   Lab Results  Component Value Date   NA 140 07/15/2018   K 4.6 07/15/2018   CO2 32 07/15/2018   GLUCOSE 136 (H) 07/15/2018   BUN 13 07/15/2018   CREATININE 0.87 07/15/2018   BILITOT 1.7 (H) 03/30/2018   ALKPHOS 96 03/30/2018   AST 22 03/30/2018   ALT 18 03/30/2018   PROT 7.7 03/30/2018   ALBUMIN 3.9 03/30/2018   CALCIUM 9.3 07/15/2018   ANIONGAP 8 04/20/2012   GFR 66.47 07/15/2018   Lab Results  Component Value Date   CHOL 146 03/30/2018   Lab Results  Component Value Date   HDL 52.70 03/30/2018   Lab Results  Component Value Date   LDLCALC 66 03/30/2018   Lab Results  Component Value Date   TRIG 138.0 03/30/2018   Lab Results  Component Value Date   CHOLHDL 3 03/30/2018   Lab Results  Component Value Date   HGBA1C 6.2 03/31/2017      Assessment & Plan:   Problem List Items Addressed This Visit      Cardiovascular and Mediastinum   Hypertension - Primary   Relevant Medications   atorvastatin (LIPITOR) 40 MG tablet   hydrochlorothiazide (HYDRODIURIL) 25 MG tablet   losartan (COZAAR) 100 MG tablet   metoprolol tartrate (LOPRESSOR) 100 MG tablet   potassium chloride (KLOR-CON 10) 10 MEQ tablet   Other Relevant Orders   Basic metabolic panel   Hepatic function panel   Microalbumin / creatinine urine ratio   Urinalysis, Routine w reflex microscopic   Microalbumin / creatinine urine ratio     Digestive   GERD (gastroesophageal reflux disease)   Relevant Medications   pantoprazole  (PROTONIX) 40 MG tablet     Nervous and Auditory   Benign head tremor   Relevant Medications   metoprolol tartrate (LOPRESSOR) 100 MG tablet     Musculoskeletal and Integument   Osteoporosis   Relevant Medications   alendronate (FOSAMAX) 70 MG tablet   OS-CAL CALCIUM + D3 500-200 MG-UNIT TABS     Other   Hyperlipidemia   Relevant Medications   atorvastatin (LIPITOR) 40 MG tablet   hydrochlorothiazide (HYDRODIURIL) 25 MG tablet   losartan (COZAAR) 100 MG tablet   metoprolol tartrate (LOPRESSOR) 100 MG tablet   Other Relevant Orders   LDL cholesterol, direct   Hepatic function panel      Meds ordered this encounter  Medications  . alendronate (FOSAMAX) 70 MG tablet    Sig: Take 1 tablet (70 mg total) by mouth once a week. Take with a full glass of water on an empty stomach.    Dispense:  4 tablet    Refill:  11  . atorvastatin (LIPITOR) 40 MG tablet    Sig: Take 1 tablet (40 mg total) by mouth daily at 6 PM.    Dispense:  90 tablet    Refill:  3  . hydrochlorothiazide (HYDRODIURIL) 25 MG tablet    Sig: TAKE 1 TABLET (25 MG TOTAL) BY MOUTH DAILY.    Dispense:  90 tablet    Refill:  1  . losartan (COZAAR) 100 MG tablet    Sig: TOME UNA TABLETA POR VIA ORAL A DIARIO    Dispense:  90 tablet    Refill:  1    Please keep upcoming appointment for future refills. Thank you  . metoprolol tartrate (LOPRESSOR) 100 MG tablet    Sig: Take 1  tablet (100 mg total) by mouth 2 (two) times daily.    Dispense:  180 tablet    Refill:  3  . OS-CAL CALCIUM + D3 500-200 MG-UNIT TABS    Sig: TOME UNA TABLETA DOS VECES AL D?A    Dispense:  60 tablet    Refill:  6  . pantoprazole (PROTONIX) 40 MG tablet    Sig: Take 1 tablet (40 mg total) by mouth daily.    Dispense:  90 tablet    Refill:  3  . potassium chloride (KLOR-CON 10) 10 MEQ tablet    Sig: TOME UNA TABLETA POR VIA ORAL TODOS LOS DIAS    Dispense:  90 tablet    Refill:  1    Follow-up: Return in about 6 months (around  08/09/2019), or if symptoms worsen or fail to improve.

## 2019-07-07 ENCOUNTER — Telehealth: Payer: Self-pay

## 2019-07-07 NOTE — Telephone Encounter (Signed)
Yes, it is okay to wait. Let us know if she develops any COVID symptoms.

## 2019-07-07 NOTE — Telephone Encounter (Signed)
Copied from [place] [id number]. Topic: General - Other >> Jul 07, 2019  9:34 AM Leward Quan A wrote: Reason for CRM: Patient granddaughter Mickel Baas called to say that her sister is exhibiting some COVID symptoms and lives in the house with this patient she was tested on 07/06/2019 but the question is. Should they wait until her results are back before getting the patient tested. Sister is quarantining from the rest of the family at this time. Ph# [phone]

## 2019-07-07 NOTE — Telephone Encounter (Signed)
Called granddaughter back, Mickel Baas, informed her of advisement of Dr. Ethelene Hal. She thanked me , will call if Pt gets sick. Call completed.

## 2019-07-14 DIAGNOSIS — Z20828 Contact with and (suspected) exposure to other viral communicable diseases: Secondary | ICD-10-CM | POA: Diagnosis not present

## 2019-07-19 ENCOUNTER — Encounter: Payer: Self-pay | Admitting: Family Medicine

## 2019-08-02 ENCOUNTER — Encounter: Payer: Self-pay | Admitting: Family Medicine

## 2019-08-09 ENCOUNTER — Ambulatory Visit: Payer: Medicaid Other | Admitting: Family Medicine

## 2019-08-10 DIAGNOSIS — Z20828 Contact with and (suspected) exposure to other viral communicable diseases: Secondary | ICD-10-CM | POA: Diagnosis not present

## 2019-08-17 ENCOUNTER — Telehealth: Payer: Self-pay

## 2019-08-17 NOTE — Telephone Encounter (Signed)

## 2019-08-18 ENCOUNTER — Ambulatory Visit (INDEPENDENT_AMBULATORY_CARE_PROVIDER_SITE_OTHER): Payer: Medicaid Other

## 2019-08-18 ENCOUNTER — Other Ambulatory Visit: Payer: Self-pay

## 2019-08-18 ENCOUNTER — Ambulatory Visit: Payer: Medicaid Other | Admitting: Family Medicine

## 2019-08-18 ENCOUNTER — Telehealth: Payer: Self-pay

## 2019-08-18 ENCOUNTER — Encounter: Payer: Self-pay | Admitting: Family Medicine

## 2019-08-18 VITALS — BP 124/70 | HR 95 | Temp 98.2°F | Ht 62.6 in

## 2019-08-18 DIAGNOSIS — M7989 Other specified soft tissue disorders: Secondary | ICD-10-CM | POA: Diagnosis not present

## 2019-08-18 DIAGNOSIS — K219 Gastro-esophageal reflux disease without esophagitis: Secondary | ICD-10-CM | POA: Diagnosis not present

## 2019-08-18 DIAGNOSIS — M7732 Calcaneal spur, left foot: Secondary | ICD-10-CM | POA: Diagnosis not present

## 2019-08-18 DIAGNOSIS — I10 Essential (primary) hypertension: Secondary | ICD-10-CM | POA: Diagnosis not present

## 2019-08-18 DIAGNOSIS — S93492A Sprain of other ligament of left ankle, initial encounter: Secondary | ICD-10-CM | POA: Diagnosis not present

## 2019-08-18 DIAGNOSIS — K5901 Slow transit constipation: Secondary | ICD-10-CM

## 2019-08-18 DIAGNOSIS — E559 Vitamin D deficiency, unspecified: Secondary | ICD-10-CM | POA: Diagnosis not present

## 2019-08-18 DIAGNOSIS — E785 Hyperlipidemia, unspecified: Secondary | ICD-10-CM

## 2019-08-18 DIAGNOSIS — S8255XA Nondisplaced fracture of medial malleolus of left tibia, initial encounter for closed fracture: Secondary | ICD-10-CM | POA: Diagnosis not present

## 2019-08-18 DIAGNOSIS — S82832A Other fracture of upper and lower end of left fibula, initial encounter for closed fracture: Secondary | ICD-10-CM | POA: Diagnosis not present

## 2019-08-18 DIAGNOSIS — S8265XA Nondisplaced fracture of lateral malleolus of left fibula, initial encounter for closed fracture: Secondary | ICD-10-CM | POA: Diagnosis not present

## 2019-08-18 LAB — LIPID PANEL
Cholesterol: 137 mg/dL (ref 0–200)
HDL: 48.7 mg/dL (ref >39.00)
LDL Cholesterol: 58 mg/dL (ref 0–99)
NonHDL: 87.8
Total CHOL/HDL Ratio: 3
Triglycerides: 148 mg/dL (ref 0.0–149.0)
VLDL: 29.6 mg/dL (ref 0.0–40.0)

## 2019-08-18 LAB — LDL CHOLESTEROL, DIRECT: Direct LDL: 64 mg/dL

## 2019-08-18 LAB — CBC
HCT: 36.6 % (ref 36.0–46.0)
Hemoglobin: 12.3 g/dL (ref 12.0–15.0)
MCHC: 33.5 g/dL (ref 30.0–36.0)
MCV: 97.1 fl (ref 78.0–100.0)
Platelets: 287 10*3/uL (ref 150.0–400.0)
RBC: 3.77 Mil/uL — ABNORMAL LOW (ref 3.87–5.11)
RDW: 12.6 % (ref 11.5–15.5)
WBC: 7.5 10*3/uL (ref 4.0–10.5)

## 2019-08-18 LAB — VITAMIN D 25 HYDROXY (VIT D DEFICIENCY, FRACTURES): VITD: 40.56 ng/mL (ref 30.00–100.00)

## 2019-08-18 LAB — COMPREHENSIVE METABOLIC PANEL
ALT: 10 U/L (ref 0–35)
AST: 17 U/L (ref 0–37)
Albumin: 4.2 g/dL (ref 3.5–5.2)
Alkaline Phosphatase: 85 U/L (ref 39–117)
BUN: 14 mg/dL (ref 6–23)
CO2: 30 mEq/L (ref 19–32)
Calcium: 9.7 mg/dL (ref 8.4–10.5)
Chloride: 99 mEq/L (ref 96–112)
Creatinine, Ser: 0.82 mg/dL (ref 0.40–1.20)
GFR: 66.78 mL/min (ref >60.00)
Glucose, Bld: 169 mg/dL — ABNORMAL HIGH (ref 70–99)
Potassium: 4.6 mEq/L (ref 3.5–5.1)
Sodium: 139 mEq/L (ref 135–145)
Total Bilirubin: 1.3 mg/dL — ABNORMAL HIGH (ref 0.2–1.2)
Total Protein: 7.8 g/dL (ref 6.0–8.3)

## 2019-08-18 MED ORDER — POLYETHYLENE GLYCOL 3350 17 GM/SCOOP PO POWD: 17.0000 g | Freq: Every day | ORAL | 1 refills | Status: AC

## 2019-08-18 MED ORDER — ANKLE BRACE DELUXE LACED/S-M MISC: 1.0000 [IU] | Freq: Every day | 1 refills | Status: DC

## 2019-08-18 NOTE — Telephone Encounter (Signed)
Ankle stirrup would work great.

## 2019-08-18 NOTE — Telephone Encounter (Signed)
I called and spoke with pt's granddaughter. I gave her the information below & they will get one for pt.

## 2019-08-18 NOTE — Telephone Encounter (Signed)
Copied from [place] [id number]. Topic: General - Other >> Aug 18, 2019  3:05 PM Pauline Good wrote: Reason for CRM: need call back concerning ankle brace the pt is suppose to wear due to her fall

## 2019-08-18 NOTE — Progress Notes (Addendum)
Established Patient Office Visit  Subjective:  Patient ID: Michele Rogers, female    DOB: 03/17/1935  Age: 84 y.o. MRN: [id number]  CC:  Chief Complaint  Patient presents with  . Follow-up    HPI Michele ParkMaria O Rogers presents for follow-up of her hypertension, elevated cholesterol and vitamin D deficiency.  Blood pressure is been well controlled with her HCTZ metoprolol.  She continues taking the atorvastatin as well without issue.  She is taking Os-Cal with vitamin D twice daily.  Protonix has helped to control her GERD.  She missed a step going out to the garage from the house this past Saturday and injured her left ankle.  It is been difficult for her to bear weight on it since that injury.  It is been treated with icing and elevation.  She has been using a cane and a walker but has been reluctant to bear weight.  Denies a history of weakness in this ankle.  Patient has been experiencing constipation and some left lower quadrant pain.  There is been no nausea vomiting fever chills or blood in her stool.  She has no history of diverticulosis.  She has never had a colonoscopy.  Past Medical History:  Diagnosis Date  . Arthritis   . Chest pain   . Headache(784.0)   . Hyperlipidemia   . Hypertension     Past Surgical History:  Procedure Laterality Date  . ABDOMINAL HYSTERECTOMY     complete  . CHOLECYSTECTOMY    . HUMERUS FRACTURE SURGERY     left arm  . VAGINAL DELIVERY     5    Family History  Problem Relation Age of Onset  . Cancer Sister        colon    Social History   Socioeconomic History  . Marital status: Widowed    Spouse name: Not on file  . Number of children: Not on file  . Years of education: Not on file  . Highest education level: Not on file  Occupational History  . Not on file  Social Needs  . Financial resource strain: Not on file  . Food insecurity    Worry: Not on file    Inability: Not on file  . Transportation needs    Medical: Not on file     Non-medical: Not on file  Tobacco Use  . Smoking status: Never Smoker  . Smokeless tobacco: Never Used  Substance and Sexual Activity  . Alcohol use: Yes    Alcohol/week: 0.0 standard drinks    Comment: one drink every couple months  . Drug use: No  . Sexual activity: Not on file  Lifestyle  . Physical activity    Days per week: Not on file    Minutes per session: Not on file  . Stress: Not on file  Relationships  . Social Musicianconnections    Talks on phone: Not on file    Gets together: Not on file    Attends religious service: Not on file    Active member of club or organization: Not on file    Attends meetings of clubs or organizations: Not on file    Relationship status: Not on file  . Intimate partner violence    Fear of current or ex partner: Not on file    Emotionally abused: Not on file    Physically abused: Not on file    Forced sexual activity: Not on file  Other Topics Concern  . Not on  file  Social History Narrative   Lives in [place], from [place]. Lives with daughter, granddaughter. No pets.     Outpatient Medications Prior to Visit  Medication Sig Dispense Refill  . alendronate (FOSAMAX) 70 MG tablet Take 1 tablet (70 mg total) by mouth once a week. Take with a full glass of water on an empty stomach. 4 tablet 11  . docusate sodium (COLACE) 100 MG capsule Take 1 capsule (100 mg total) by mouth 2 (two) times daily. 60 capsule 0  . hydrochlorothiazide (HYDRODIURIL) 25 MG tablet TAKE 1 TABLET (25 MG TOTAL) BY MOUTH DAILY. 90 tablet 1  . loratadine (CLARITIN) 10 MG tablet Take 1 tablet (10 mg total) by mouth daily. 30 tablet 11  . losartan (COZAAR) 100 MG tablet TOME UNA TABLETA POR VIA ORAL A DIARIO 90 tablet 1  . metoprolol tartrate (LOPRESSOR) 100 MG tablet Take 1 tablet (100 mg total) by mouth 2 (two) times daily. 180 tablet 3  . OS-CAL CALCIUM + D3 500-200 MG-UNIT TABS TOME UNA TABLETA DOS VECES AL D?A 60 tablet 6  . pantoprazole (PROTONIX) 40 MG tablet Take  1 tablet (40 mg total) by mouth daily. 90 tablet 3  . potassium chloride (KLOR-CON 10) 10 MEQ tablet TOME UNA TABLETA POR VIA ORAL TODOS LOS DIAS 90 tablet 1  . atorvastatin (LIPITOR) 40 MG tablet Take 1 tablet (40 mg total) by mouth daily at 6 PM. 90 tablet 3   No facility-administered medications prior to visit.     Allergies  Allergen Reactions  . Lisinopril Cough    ROS Review of Systems  Constitutional: Negative.   HENT: Negative.   Eyes: Negative for photophobia and visual disturbance.  Respiratory: Negative.   Cardiovascular: Negative.   Endocrine: Negative for polyphagia and polyuria.  Genitourinary: Negative for difficulty urinating, frequency and urgency.  Musculoskeletal: Positive for arthralgias and gait problem.  Skin: Negative for pallor.  Allergic/Immunologic: Negative for immunocompromised state.  Neurological: Negative for light-headedness and headaches.  Hematological: Does not bruise/bleed easily.  Psychiatric/Behavioral: Negative.       Objective:    Physical Exam  Constitutional: She is oriented to person, place, and time. She appears well-developed and well-nourished. No distress.  HENT:  Head: Normocephalic and atraumatic.  Right Ear: External ear normal.  Left Ear: External ear normal.  Mouth/Throat: Oropharynx is clear and moist.  Eyes: Pupils are equal, round, and reactive to light. Conjunctivae are normal. Right eye exhibits no discharge. Left eye exhibits no discharge. No scleral icterus.  Neck: Neck supple. No JVD present. No tracheal deviation present. No thyromegaly present.  Cardiovascular: Normal rate, regular rhythm and normal heart sounds.  Pulses:      Dorsalis pedis pulses are 2+ on the left side.       Posterior tibial pulses are 1+ on the left side.  Pulmonary/Chest: Effort normal and breath sounds normal. No stridor.  Abdominal: Soft. Bowel sounds are normal. She exhibits no distension. There is no abdominal tenderness. There is no  rebound and no guarding.  Musculoskeletal:        General: No edema.     Left foot: Decreased range of motion. Tenderness, bony tenderness and swelling present.       Feet:  Lymphadenopathy:    She has no cervical adenopathy.  Neurological: She is alert and oriented to person, place, and time.  Skin: Skin is warm and dry. She is not diaphoretic.  Psychiatric: She has a normal mood and affect. Her behavior is  normal.    BP 124/70   Pulse 95   Temp 98.2 F (36.8 C) (Temporal)   Ht 5' 2.6" (1.59 m)   SpO2 98%   BMI 25.19 kg/m  Wt Readings from Last 3 Encounters:  02/08/19 140 lb 6 oz (63.7 kg)  07/15/18 140 lb 8 oz (63.7 kg)  05/04/18 136 lb (61.7 kg)   BP Readings from Last 3 Encounters:  08/18/19 124/70  02/08/19 128/70  07/15/18 122/78   Guideline developer:  UpToDate (see UpToDate for funding source) Date Released: June 2014  Health Maintenance Due  Topic Date Due  . TETANUS/TDAP  11/06/1956  . PNA vac Low Risk Adult (1 of 2 - PCV13) 11/06/2002  . INFLUENZA VACCINE  07/30/2019    There are no preventive care reminders to display for this patient.  Lab Results  Component Value Date   TSH 1.830 04/22/2017   Lab Results  Component Value Date   WBC 7.5 08/18/2019   HGB 12.3 08/18/2019   HCT 36.6 08/18/2019   MCV 97.1 08/18/2019   PLT 287.0 08/18/2019   Lab Results  Component Value Date   NA 139 08/18/2019   K 4.6 08/18/2019   CO2 30 08/18/2019   GLUCOSE 169 (H) 08/18/2019   BUN 14 08/18/2019   CREATININE 0.82 08/18/2019   BILITOT 1.3 (H) 08/18/2019   ALKPHOS 85 08/18/2019   AST 17 08/18/2019   ALT 10 08/18/2019   PROT 7.8 08/18/2019   ALBUMIN 4.2 08/18/2019   CALCIUM 9.7 08/18/2019   ANIONGAP 8 04/20/2012   GFR 66.78 08/18/2019   Lab Results  Component Value Date   CHOL 137 08/18/2019   Lab Results  Component Value Date   HDL 48.70 08/18/2019   Lab Results  Component Value Date   LDLCALC 58 08/18/2019   Lab Results  Component Value  Date   TRIG 148.0 08/18/2019   Lab Results  Component Value Date   CHOLHDL 3 08/18/2019   Lab Results  Component Value Date   HGBA1C 6.2 03/31/2017      Assessment & Plan:   Problem List Items Addressed This Visit      Cardiovascular and Mediastinum   Hypertension - Primary   Relevant Orders   CBC (Completed)   Comprehensive metabolic panel (Completed)     Digestive   GERD (gastroesophageal reflux disease)   Relevant Medications   polyethylene glycol powder (GLYCOLAX/MIRALAX) 17 GM/SCOOP powder   Slow transit constipation   Relevant Medications   polyethylene glycol powder (GLYCOLAX/MIRALAX) 17 GM/SCOOP powder     Musculoskeletal and Integument   Sprain of anterior talofibular ligament of left ankle   Relevant Medications   Elastic Bandages & Supports (ANKLE BRACE DELUXE LACED/S-M) MISC   Other Relevant Orders   DG Ankle Complete Left (Completed)   Ambulatory referral to Orthopedic Surgery   Closed fracture of left distal fibula   Relevant Orders   DG Tibia/Fibula Left (Completed)   Ambulatory referral to Orthopedic Surgery     Other   Hyperlipidemia   Relevant Orders   Comprehensive metabolic panel (Completed)   LDL cholesterol, direct (Completed)   Lipid panel (Completed)   Vitamin D deficiency   Relevant Orders   VITAMIN D 25 Hydroxy (Vit-D Deficiency, Fractures) (Completed)      Meds ordered this encounter  Medications  . polyethylene glycol powder (GLYCOLAX/MIRALAX) 17 GM/SCOOP powder    Sig: Take 17 g by mouth daily.    Dispense:  850 g    Refill:  1  . Elastic Bandages & Supports (ANKLE BRACE DELUXE LACED/S-M) MISC    Sig: 1 Units by Does not apply route daily.    Dispense:  1 each    Refill:  1    Follow-up: Return in about 1 week (around 08/25/2019).

## 2019-08-19 NOTE — Addendum Note (Signed)
Addended by: Jon Billings on: 08/19/2019 09:41 AM   Modules accepted: Orders

## 2019-08-22 ENCOUNTER — Telehealth: Payer: Self-pay

## 2019-08-22 NOTE — Telephone Encounter (Signed)
I called and left message on patient voicemail, that referral was entered on Friday and in the process and they will call patient to schedule appointment.

## 2019-08-22 NOTE — Telephone Encounter (Signed)
Can we get her ortho referral sent over?   Copied from [place]. Topic: General - Other >> Aug 22, 2019  3:03 PM Rainey Pines A wrote: Patient would like a callback in regards to referral status to ortho doctor. Best contact [phone]

## 2019-08-23 ENCOUNTER — Ambulatory Visit: Payer: Medicaid Other | Admitting: Family Medicine

## 2019-08-26 NOTE — Telephone Encounter (Signed)
Pt's granddaughter, Mickel Baas, called.  States that there is no referral for ortho and she would like to speak with someone to know exactly where this has gone.

## 2019-08-29 ENCOUNTER — Telehealth: Payer: Self-pay

## 2019-08-29 NOTE — Telephone Encounter (Signed)
Pt's granddaughter want to know the status of the referral. Please call.

## 2019-08-29 NOTE — Telephone Encounter (Signed)
Copied from [place]. Topic: General - Other >> Aug 22, 2019  3:03 PM Rainey Pines A wrote: Patient would like a callback in regards to referral status to ortho doctor. Best contact [phone] >> Aug 29, 2019  9:14 AM Leward Quan A wrote: Patient granddaughter called to check on the status of the referral to the [facility] asking for referral to be faxed to fax# [phone] she states that they are waiting for the referral and that he grand mother  Is in a lot of pain. Please call Ph# [phone] when done.

## 2019-08-31 DIAGNOSIS — R262 Difficulty in walking, not elsewhere classified: Secondary | ICD-10-CM | POA: Diagnosis not present

## 2019-08-31 DIAGNOSIS — S82892A Other fracture of left lower leg, initial encounter for closed fracture: Secondary | ICD-10-CM | POA: Diagnosis not present

## 2019-08-31 DIAGNOSIS — M25475 Effusion, left foot: Secondary | ICD-10-CM | POA: Diagnosis not present

## 2019-08-31 DIAGNOSIS — M25572 Pain in left ankle and joints of left foot: Secondary | ICD-10-CM | POA: Diagnosis not present

## 2019-09-14 DIAGNOSIS — R262 Difficulty in walking, not elsewhere classified: Secondary | ICD-10-CM | POA: Diagnosis not present

## 2019-09-14 DIAGNOSIS — M25472 Effusion, left ankle: Secondary | ICD-10-CM | POA: Diagnosis not present

## 2019-09-14 DIAGNOSIS — S82892G Other fracture of left lower leg, subsequent encounter for closed fracture with delayed healing: Secondary | ICD-10-CM | POA: Diagnosis not present

## 2019-09-14 DIAGNOSIS — M25572 Pain in left ankle and joints of left foot: Secondary | ICD-10-CM | POA: Diagnosis not present

## 2019-09-28 DIAGNOSIS — M25472 Effusion, left ankle: Secondary | ICD-10-CM | POA: Diagnosis not present

## 2019-09-28 DIAGNOSIS — M25572 Pain in left ankle and joints of left foot: Secondary | ICD-10-CM | POA: Diagnosis not present

## 2019-09-28 DIAGNOSIS — S82892G Other fracture of left lower leg, subsequent encounter for closed fracture with delayed healing: Secondary | ICD-10-CM | POA: Diagnosis not present

## 2019-09-28 DIAGNOSIS — R262 Difficulty in walking, not elsewhere classified: Secondary | ICD-10-CM | POA: Diagnosis not present

## 2019-10-06 ENCOUNTER — Other Ambulatory Visit: Payer: Self-pay | Admitting: Family Medicine

## 2019-10-06 DIAGNOSIS — I10 Essential (primary) hypertension: Secondary | ICD-10-CM

## 2019-10-11 ENCOUNTER — Telehealth: Payer: Self-pay

## 2019-10-11 NOTE — Telephone Encounter (Signed)
Called pt to set up evisit with NA 10/12/2019, left message asking pt to call the office.  

## 2019-10-19 DIAGNOSIS — M25472 Effusion, left ankle: Secondary | ICD-10-CM | POA: Diagnosis not present

## 2019-10-19 DIAGNOSIS — M25572 Pain in left ankle and joints of left foot: Secondary | ICD-10-CM | POA: Diagnosis not present

## 2019-10-19 DIAGNOSIS — S82892G Other fracture of left lower leg, subsequent encounter for closed fracture with delayed healing: Secondary | ICD-10-CM | POA: Diagnosis not present

## 2019-10-19 DIAGNOSIS — R262 Difficulty in walking, not elsewhere classified: Secondary | ICD-10-CM | POA: Diagnosis not present

## 2019-10-26 DIAGNOSIS — Z23 Encounter for immunization: Secondary | ICD-10-CM | POA: Diagnosis not present

## 2019-10-31 ENCOUNTER — Other Ambulatory Visit: Payer: Self-pay | Admitting: Family Medicine

## 2019-10-31 DIAGNOSIS — I10 Essential (primary) hypertension: Secondary | ICD-10-CM

## 2019-11-07 DIAGNOSIS — S82892G Other fracture of left lower leg, subsequent encounter for closed fracture with delayed healing: Secondary | ICD-10-CM | POA: Diagnosis not present

## 2020-03-04 ENCOUNTER — Other Ambulatory Visit: Payer: Self-pay | Admitting: Family Medicine

## 2020-03-04 DIAGNOSIS — M810 Age-related osteoporosis without current pathological fracture: Secondary | ICD-10-CM

## 2020-04-18 ENCOUNTER — Encounter: Payer: Self-pay | Admitting: Physician Assistant

## 2020-04-18 ENCOUNTER — Telehealth (INDEPENDENT_AMBULATORY_CARE_PROVIDER_SITE_OTHER): Payer: Medicaid Other | Admitting: Physician Assistant

## 2020-04-18 ENCOUNTER — Other Ambulatory Visit: Payer: Self-pay

## 2020-04-18 VITALS — Ht 62.6 in | Wt 137.0 lb

## 2020-04-18 DIAGNOSIS — R42 Dizziness and giddiness: Secondary | ICD-10-CM

## 2020-04-18 DIAGNOSIS — E782 Mixed hyperlipidemia: Secondary | ICD-10-CM | POA: Diagnosis not present

## 2020-04-18 DIAGNOSIS — I10 Essential (primary) hypertension: Secondary | ICD-10-CM

## 2020-04-18 NOTE — Patient Instructions (Signed)
Medication Instructions:  Your physician recommends that you continue on your current medications as directed. Please refer to the Current Medication list given to you today.  *If you need a refill on your cardiac medications before your next appointment, please call your pharmacy*   Lab Work: None Ordered   If you have labs (blood work) drawn today and your tests are completely normal, you will receive your results only by: Marland Kitchen MyChart Message (if you have MyChart) OR . A paper copy in the mail If you have any lab test that is abnormal or we need to change your treatment, we will call you to review the results.   Testing/Procedures: None   Follow-Up: At [facility], you and your health needs are our priority.  As part of our continuing mission to provide you with exceptional heart care, we have created designated Provider Care Teams.  These Care Teams include your primary Cardiologist (physician) and Advanced Practice Providers (APPs -  Physician Assistants and Nurse Practitioners) who all work together to provide you with the care you need, when you need it.  We recommend signing up for the patient portal called "MyChart".  Sign up information is provided on this After Visit Summary.  MyChart is used to connect with patients for Virtual Visits (Telemedicine).  Patients are able to view lab/test results, encounter notes, upcoming appointments, etc.  Non-urgent messages can be sent to your provider as well.   To learn more about what you can do with MyChart, go to [email or web address].    Your next appointment:   12 month(s)  The format for your next appointment:   In Person  Provider:   You may see Kristeen Miss, MD or one of the following Advanced Practice Providers on your designated Care Team:    Tereso Newcomer, PA-C  [place], [place]

## 2020-04-18 NOTE — Progress Notes (Signed)
Virtual Visit via Telephone Note   This visit type was conducted due to national recommendations for restrictions regarding the COVID-19 Pandemic (e.g. social distancing) in an effort to limit this patient's exposure and mitigate transmission in our community.  Due to her co-morbid illnesses, this patient is at least at moderate risk for complications without adequate follow up.  This format is felt to be most appropriate for this patient at this time.  The patient did not have access to video technology/had technical difficulties with video requiring transitioning to audio format only (telephone).  All issues noted in this document were discussed and addressed.  No physical exam could be performed with this format.  Please refer to the patient's chart for her  consent to telehealth for [facility].   The patient was identified using 2 identifiers.  Date:  04/18/2020   ID:  Michele Rogers, DOB April 03, 1935, MRN [id number]  Patient Location: Home Provider Location: Office  PCP:  Libby Maw, MD  Cardiologist:  Mertie Moores, MD   Evaluation Performed:  Follow-Up Visit  Chief Complaint:  2 yr follow up  History of Present Illness:    Michele Rogers is a 85 y.o. female with with hx of HTN and HLD seen for follow up.   Holder monitor 04/2017: NSR Occasional PVCs, PACs  No significant arrhytmias   Last seen by Dr. Acie Fredrickson 03/2018.   Seen virtually with help of granddaughter.  She  does not have blood pressure cuff at home.  Reassuring lab work by PCP August 2020-personally reviewed.  Patient denies chest pain, shortness of breath, orthopnea, PND, palpitation, syncope or melena.  She has intermittent dizziness without any associated symptoms.  She tries to be active doing household work and walking.  The patient does not have symptoms concerning for COVID-19 infection (fever, chills, cough, or new shortness of breath).    Past Medical History:  Diagnosis Date  .  Arthritis   . Chest pain   . Headache(784.0)   . Hyperlipidemia   . Hypertension    Past Surgical History:  Procedure Laterality Date  . ABDOMINAL HYSTERECTOMY     complete  . CHOLECYSTECTOMY    . HUMERUS FRACTURE SURGERY     left arm  . VAGINAL DELIVERY     5     Current Meds  Medication Sig  . alendronate (FOSAMAX) 70 MG tablet TAKE 1 TABLET BY MOUTH ONCE A WEEK. TAKE WITH A FULL GLASS OF WATER ON AN EMPTY STOMACH.  Marland Kitchen atorvastatin (LIPITOR) 40 MG tablet Take 1 tablet (40 mg total) by mouth daily at 6 PM.  . Cholecalciferol 50 MCG (2000 UT) CAPS Take 2,000 Units by mouth daily.  Marland Kitchen docusate sodium (COLACE) 100 MG capsule Take 1 capsule (100 mg total) by mouth 2 (two) times daily.  . Elastic Bandages & Supports (ANKLE BRACE DELUXE LACED/S-M) MISC 1 Units by Does not apply route daily.  . hydrochlorothiazide (HYDRODIURIL) 25 MG tablet TOME UNA TABLETA TODOS LOS DIAS  . loratadine (CLARITIN) 10 MG tablet Take 1 tablet (10 mg total) by mouth daily.  Marland Kitchen losartan (COZAAR) 100 MG tablet TOME UNA TABLETA POR VIA ORAL A DIARIO  . metoprolol tartrate (LOPRESSOR) 100 MG tablet Take 1 tablet (100 mg total) by mouth 2 (two) times daily.  Dellia Nims CALCIUM + D3 500-200 MG-UNIT TABS TOME UNA TABLETA DOS VECES AL D?A  . pantoprazole (PROTONIX) 40 MG tablet Take 1 tablet (40 mg total) by mouth daily.  Marland Kitchen  polyethylene glycol powder (GLYCOLAX/MIRALAX) 17 GM/SCOOP powder Take 17 g by mouth daily.  . potassium chloride (KLOR-CON 10) 10 MEQ tablet TOME UNA TABLETA POR VIA ORAL TODOS LOS DIAS     Allergies:   Lisinopril   Social History   Tobacco Use  . Smoking status: Never Smoker  . Smokeless tobacco: Never Used  Substance Use Topics  . Alcohol use: Yes    Alcohol/week: 0.0 standard drinks    Comment: one drink every couple months  . Drug use: No     Family Hx: The patient's family history includes Cancer in her sister.  ROS:   Please see the history of present illness.    All other  systems reviewed and are negative.   Prior CV studies:   The following studies were reviewed today:  As above  Labs/Other Tests and Data Reviewed:    EKG:  No ECG reviewed.  Recent Labs: 08/18/2019: ALT 10; BUN 14; Creatinine, Ser 0.82; Hemoglobin 12.3; Platelets 287.0; Potassium 4.6; Sodium 139   Recent Lipid Panel Lab Results  Component Value Date/Time   CHOL 137 08/18/2019 11:01 AM   CHOL 140 10/02/2017 11:46 AM   TRIG 148.0 08/18/2019 11:01 AM   HDL 48.70 08/18/2019 11:01 AM   HDL 52 10/02/2017 11:46 AM   CHOLHDL 3 08/18/2019 11:01 AM   LDLCALC 58 08/18/2019 11:01 AM   LDLCALC 53 10/02/2017 11:46 AM   LDLDIRECT 64.0 08/18/2019 11:01 AM    Wt Readings from Last 3 Encounters:  04/18/20 137 lb (62.1 kg)  02/08/19 140 lb 6 oz (63.7 kg)  07/15/18 140 lb 8 oz (63.7 kg)     Objective:    Vital Signs:  Ht 5' 2.6" (1.59 m)   Wt 137 lb (62.1 kg)   BMI 24.58 kg/m    VITAL SIGNS:  reviewed GEN:  no acute distress PSYCH:  normal affect  ASSESSMENT & PLAN:    1. HTN -Granddaughter will get new blood pressure cuff.  No change in therapy.  2. HLD -Managed by PCP.  Continue statin.  3.  Dizziness -Intermittent without associated symptoms.  Advised to get vitals during episodes.  Will give [place] a call if worsening symptoms or reach out to PCP.  COVID-19 Education: The signs and symptoms of COVID-19 were discussed with the patient and how to seek care for testing (follow up with PCP or arrange E-visit).  The importance of social distancing was discussed today.  Time:   Today, I have spent 8 minutes with the patient with telehealth technology discussing the above problems.     Medication Adjustments/Labs and Tests Ordered: Current medicines are reviewed at length with the patient today.  Concerns regarding medicines are outlined above.   Tests Ordered: No orders of the defined types were placed in this encounter.   Medication Changes: No orders of the defined  types were placed in this encounter.   Follow Up:  Either In Person or Virtual in 1 year(s)  Signed, Manson Passey, PA  04/18/2020 1:38 PM    [facility]

## 2020-05-01 ENCOUNTER — Other Ambulatory Visit: Payer: Self-pay | Admitting: Family Medicine

## 2020-05-01 DIAGNOSIS — I10 Essential (primary) hypertension: Secondary | ICD-10-CM

## 2020-06-20 ENCOUNTER — Other Ambulatory Visit: Payer: Self-pay | Admitting: Family Medicine

## 2020-06-20 DIAGNOSIS — I10 Essential (primary) hypertension: Secondary | ICD-10-CM

## 2020-06-20 DIAGNOSIS — G250 Essential tremor: Secondary | ICD-10-CM

## 2020-06-25 ENCOUNTER — Telehealth: Payer: Self-pay | Admitting: Family Medicine

## 2020-06-25 NOTE — Telephone Encounter (Signed)
Patient needs 90 day refills of potassium, hydrochlorothiazide, losartan and metoprolol. [facility] to same [facility] [address] in [place].

## 2020-06-26 ENCOUNTER — Other Ambulatory Visit: Payer: Self-pay

## 2020-06-26 DIAGNOSIS — I10 Essential (primary) hypertension: Secondary | ICD-10-CM

## 2020-06-26 DIAGNOSIS — G250 Essential tremor: Secondary | ICD-10-CM

## 2020-06-26 MED ORDER — HYDROCHLOROTHIAZIDE 25 MG PO TABS: ORAL_TABLET | ORAL | 0 refills | Status: DC

## 2020-06-26 MED ORDER — LOSARTAN POTASSIUM 100 MG PO TABS: 100.0000 mg | ORAL_TABLET | Freq: Every day | ORAL | 0 refills | Status: DC

## 2020-06-26 MED ORDER — METOPROLOL TARTRATE 100 MG PO TABS: 100.0000 mg | ORAL_TABLET | Freq: Two times a day (BID) | ORAL | 0 refills | Status: DC

## 2020-06-26 MED ORDER — POTASSIUM CHLORIDE ER 10 MEQ PO TBCR: EXTENDED_RELEASE_TABLET | ORAL | 0 refills | Status: DC

## 2020-06-26 NOTE — Telephone Encounter (Signed)
Patients grand daughter calling for refill on pending medications. Last OV August 2020 I tried to see about scheduling patient to come in for follow up on medications per granddaughter patient will be leaving for [place] on July 1st and should be back by August they will call when she returns to schedule appointment. Is it okay to refill meds. Please advise

## 2020-08-13 ENCOUNTER — Other Ambulatory Visit: Payer: Self-pay

## 2020-08-14 ENCOUNTER — Ambulatory Visit (INDEPENDENT_AMBULATORY_CARE_PROVIDER_SITE_OTHER): Payer: Medicaid Other | Admitting: Family Medicine

## 2020-08-14 ENCOUNTER — Encounter: Payer: Self-pay | Admitting: Family Medicine

## 2020-08-14 VITALS — BP 108/64 | HR 87 | Temp 97.3°F | Ht 62.0 in | Wt 135.6 lb

## 2020-08-14 DIAGNOSIS — R7309 Other abnormal glucose: Secondary | ICD-10-CM | POA: Diagnosis not present

## 2020-08-14 DIAGNOSIS — E785 Hyperlipidemia, unspecified: Secondary | ICD-10-CM | POA: Diagnosis not present

## 2020-08-14 DIAGNOSIS — M810 Age-related osteoporosis without current pathological fracture: Secondary | ICD-10-CM

## 2020-08-14 DIAGNOSIS — I10 Essential (primary) hypertension: Secondary | ICD-10-CM

## 2020-08-14 DIAGNOSIS — Z0000 Encounter for general adult medical examination without abnormal findings: Secondary | ICD-10-CM | POA: Insufficient documentation

## 2020-08-14 LAB — MICROALBUMIN / CREATININE URINE RATIO
Creatinine,U: 114.1 mg/dL
Microalb Creat Ratio: 0.7 mg/g (ref 0.0–30.0)
Microalb, Ur: 0.8 mg/dL (ref 0.0–1.9)

## 2020-08-14 LAB — CBC
HCT: 39.6 % (ref 36.0–46.0)
Hemoglobin: 13.3 g/dL (ref 12.0–15.0)
MCHC: 33.6 g/dL (ref 30.0–36.0)
MCV: 98 fl (ref 78.0–100.0)
Platelets: 213 10*3/uL (ref 150.0–400.0)
RBC: 4.04 Mil/uL (ref 3.87–5.11)
RDW: 13 % (ref 11.5–15.5)
WBC: 6.6 10*3/uL (ref 4.0–10.5)

## 2020-08-14 LAB — COMPREHENSIVE METABOLIC PANEL
ALT: 13 U/L (ref 0–35)
AST: 19 U/L (ref 0–37)
Albumin: 4.2 g/dL (ref 3.5–5.2)
Alkaline Phosphatase: 72 U/L (ref 39–117)
BUN: 20 mg/dL (ref 6–23)
CO2: 32 mEq/L (ref 19–32)
Calcium: 9.8 mg/dL (ref 8.4–10.5)
Chloride: 99 mEq/L (ref 96–112)
Creatinine, Ser: 1 mg/dL (ref 0.40–1.20)
GFR: 52.98 mL/min — ABNORMAL LOW (ref >60.00)
Glucose, Bld: 105 mg/dL — ABNORMAL HIGH (ref 70–99)
Potassium: 4.8 mEq/L (ref 3.5–5.1)
Sodium: 139 mEq/L (ref 135–145)
Total Bilirubin: 1.2 mg/dL (ref 0.2–1.2)
Total Protein: 7.6 g/dL (ref 6.0–8.3)

## 2020-08-14 LAB — LIPID PANEL
Cholesterol: 154 mg/dL (ref 0–200)
HDL: 52.3 mg/dL (ref >39.00)
LDL Cholesterol: 69 mg/dL (ref 0–99)
NonHDL: 101.24
Total CHOL/HDL Ratio: 3
Triglycerides: 163 mg/dL — ABNORMAL HIGH (ref 0.0–149.0)
VLDL: 32.6 mg/dL (ref 0.0–40.0)

## 2020-08-14 LAB — HEMOGLOBIN A1C: Hgb A1c MFr Bld: 6.3 % (ref 4.6–6.5)

## 2020-08-14 LAB — LDL CHOLESTEROL, DIRECT: Direct LDL: 76 mg/dL

## 2020-08-14 LAB — TSH: TSH: 1.65 u[IU]/mL (ref 0.35–4.50)

## 2020-08-14 NOTE — Progress Notes (Signed)
Established Patient Office Visit  Subjective:  Patient ID: Michele Rogers, female    DOB: [birth year]  Age: 85 y.o. MRN: [id number]  CC:  Chief Complaint  Patient presents with   Annual Exam    CPE, no concerns     HPI Michele Rogers presents for follow-up of hypertension, elevated cholesterol, osteoporosis and elevated glucose.  No history of diabetes.  Hemoglobin A1c was 6.23 years ago.  Continues with Fosamax calcium and vitamin D for osteoporosis.  Blood pressure treated with HCTZ Cozaar and metoprolol.  Continues atorvastatin 40 mg daily for cholesterol.  She is accompanied by her daughter today.  She is taking potassium supplementation.  Past Medical History:  Diagnosis Date   Arthritis    Chest pain    Headache(784.0)    Hyperlipidemia    Hypertension     Past Surgical History:  Procedure Laterality Date   ABDOMINAL HYSTERECTOMY     complete   CHOLECYSTECTOMY     HUMERUS FRACTURE SURGERY     left arm   VAGINAL DELIVERY     5    Family History  Problem Relation Age of Onset   Cancer Sister        colon    Social History   Socioeconomic History   Marital status: Widowed    Spouse name: Not on file   Number of children: Not on file   Years of education: Not on file   Highest education level: Not on file  Occupational History   Not on file  Tobacco Use   Smoking status: Never Smoker   Smokeless tobacco: Never Used  Vaping Use   Vaping Use: Never used  Substance and Sexual Activity   Alcohol use: Yes    Alcohol/week: 0.0 standard drinks    Comment: one drink every couple months   Drug use: No   Sexual activity: Not on file  Other Topics Concern   Not on file  Social History Narrative   Lives in [place], from [place]. Lives with daughter, granddaughter. No pets.    Social Determinants of Health   Financial Resource Strain:    Difficulty of Paying Living Expenses:   Food Insecurity:    Worried About Brewing technologist in the Last Year:    Barista in the Last Year:   Transportation Needs:    Freight forwarder (Medical):    Lack of Transportation (Non-Medical):   Physical Activity:    Days of Exercise per Week:    Minutes of Exercise per Session:   Stress:    Feeling of Stress :   Social Connections:    Frequency of Communication with Friends and Family:    Frequency of Social Gatherings with Friends and Family:    Attends Religious Services:    Active Member of Clubs or Organizations:    Attends Engineer, structural:    Marital Status:   Intimate Partner Violence:    Fear of Current or Ex-Partner:    Emotionally Abused:    Physically Abused:    Sexually Abused:     Outpatient Medications Prior to Visit  Medication Sig Dispense Refill   alendronate (FOSAMAX) 70 MG tablet TAKE 1 TABLET BY MOUTH ONCE A WEEK. TAKE WITH A FULL GLASS OF WATER ON AN EMPTY STOMACH. 4 tablet 11   Cholecalciferol 50 MCG (2000 UT) CAPS Take 2,000 Units by mouth daily.     docusate sodium (COLACE) 100 MG capsule Take 1  capsule (100 mg total) by mouth 2 (two) times daily. 60 capsule 0   Elastic Bandages & Supports (ANKLE BRACE DELUXE LACED/S-M) MISC 1 Units by Does not apply route daily. 1 each 1   hydrochlorothiazide (HYDRODIURIL) 25 MG tablet TOME UNA TABLETA TODOS LOS DIAS 90 tablet 0   loratadine (CLARITIN) 10 MG tablet Take 1 tablet (10 mg total) by mouth daily. 30 tablet 11   losartan (COZAAR) 100 MG tablet Take 1 tablet (100 mg total) by mouth daily. 90 tablet 0   metoprolol tartrate (LOPRESSOR) 100 MG tablet Take 1 tablet (100 mg total) by mouth 2 (two) times daily. 180 tablet 0   OS-CAL CALCIUM + D3 500-200 MG-UNIT TABS TOME UNA TABLETA DOS VECES AL D?A 60 tablet 6   pantoprazole (PROTONIX) 40 MG tablet Take 1 tablet (40 mg total) by mouth daily. 90 tablet 3   polyethylene glycol powder (GLYCOLAX/MIRALAX) 17 GM/SCOOP powder Take 17 g by mouth daily. 850 g 1    potassium chloride (KLOR-CON) 10 MEQ tablet TOME UNA TABLETA TODOS LOS DIAS 90 tablet 0   atorvastatin (LIPITOR) 40 MG tablet Take 1 tablet (40 mg total) by mouth daily at 6 PM. 90 tablet 3   No facility-administered medications prior to visit.    Allergies  Allergen Reactions   Lisinopril Cough    ROS Review of Systems  Constitutional: Negative.   HENT: Negative.   Eyes: Negative for photophobia and visual disturbance.  Respiratory: Negative.   Cardiovascular: Negative.   Gastrointestinal: Negative.   Endocrine: Negative for polyphagia and polyuria.  Genitourinary: Negative for difficulty urinating, dysuria, frequency and urgency.  Musculoskeletal: Negative for gait problem and joint swelling.  Skin: Negative for pallor and rash.  Allergic/Immunologic: Negative for immunocompromised state.  Neurological: Negative for light-headedness and numbness.  Hematological: Does not bruise/bleed easily.  Psychiatric/Behavioral: Negative.       Objective:    Physical Exam Vitals and nursing note reviewed.  Constitutional:      General: She is not in acute distress.    Appearance: Normal appearance. She is not ill-appearing, toxic-appearing or diaphoretic.  HENT:     Head: Normocephalic and atraumatic.     Right Ear: Tympanic membrane, ear canal and external ear normal.     Left Ear: Tympanic membrane, ear canal and external ear normal.  Eyes:     General:        Right eye: No discharge.        Left eye: No discharge.     Extraocular Movements: Extraocular movements intact.     Conjunctiva/sclera: Conjunctivae normal.     Pupils: Pupils are equal, round, and reactive to light.  Cardiovascular:     Rate and Rhythm: Normal rate and regular rhythm.  Pulmonary:     Effort: Pulmonary effort is normal.     Breath sounds: Normal breath sounds.  Abdominal:     General: Abdomen is flat. Bowel sounds are normal. There is no distension.     Palpations: Abdomen is soft. There is no  mass.     Tenderness: There is no abdominal tenderness. There is no guarding or rebound.     Hernia: No hernia is present.  Musculoskeletal:        General: No swelling, tenderness, deformity or signs of injury.     Cervical back: Normal range of motion and neck supple. No rigidity or tenderness.  Lymphadenopathy:     Cervical: No cervical adenopathy.  Skin:    General: Skin is  warm and dry.  Neurological:     Mental Status: She is alert and oriented to person, place, and time.  Psychiatric:        Mood and Affect: Mood normal.        Behavior: Behavior normal.     BP 108/64    Pulse 87    Temp (!) 97.3 F (36.3 C) (Tympanic)    Ht 5\' 2"  (1.575 m)    Wt 135 lb 9.6 oz (61.5 kg)    SpO2 95%    BMI 24.80 kg/m  Wt Readings from Last 3 Encounters:  08/14/20 135 lb 9.6 oz (61.5 kg)  04/18/20 137 lb (62.1 kg)  02/08/19 140 lb 6 oz (63.7 kg)     Health Maintenance Due  Topic Date Due   TETANUS/TDAP  Never done   PNA vac Low Risk Adult (1 of 2 - PCV13) Never done   INFLUENZA VACCINE  07/29/2020    There are no preventive care reminders to display for this patient.  Lab Results  Component Value Date   TSH 1.830 04/22/2017   Lab Results  Component Value Date   WBC 7.5 08/18/2019   HGB 12.3 08/18/2019   HCT 36.6 08/18/2019   MCV 97.1 08/18/2019   PLT 287.0 08/18/2019   Lab Results  Component Value Date   NA 139 08/18/2019   K 4.6 08/18/2019   CO2 30 08/18/2019   GLUCOSE 169 (H) 08/18/2019   BUN 14 08/18/2019   CREATININE 0.82 08/18/2019   BILITOT 1.3 (H) 08/18/2019   ALKPHOS 85 08/18/2019   AST 17 08/18/2019   ALT 10 08/18/2019   PROT 7.8 08/18/2019   ALBUMIN 4.2 08/18/2019   CALCIUM 9.7 08/18/2019   ANIONGAP 8 04/20/2012   GFR 66.78 08/18/2019   Lab Results  Component Value Date   CHOL 137 08/18/2019   Lab Results  Component Value Date   HDL 48.70 08/18/2019   Lab Results  Component Value Date   LDLCALC 58 08/18/2019   Lab Results  Component  Value Date   TRIG 148.0 08/18/2019   Lab Results  Component Value Date   CHOLHDL 3 08/18/2019   Lab Results  Component Value Date   HGBA1C 6.2 03/31/2017      Assessment & Plan:   Problem List Items Addressed This Visit      Cardiovascular and Mediastinum   Essential hypertension - Primary   Relevant Orders   CBC   Comprehensive metabolic panel   Urinalysis, Routine w reflex microscopic   Microalbumin / creatinine urine ratio     Musculoskeletal and Integument   Osteoporosis     Other   Hyperlipidemia   Relevant Orders   LDL cholesterol, direct   Lipid panel   TSH   Elevated glucose   Relevant Orders   Hemoglobin A1c      No orders of the defined types were placed in this encounter.   Follow-up: Return in about 6 months (around 02/14/2021).   Continue all medications as noted above. 02/16/2021, MD

## 2020-08-15 LAB — URINALYSIS, ROUTINE W REFLEX MICROSCOPIC
Bilirubin Urine: NEGATIVE
Hgb urine dipstick: NEGATIVE
Nitrite: NEGATIVE
Specific Gravity, Urine: 1.01 (ref 1.000–1.030)
Total Protein, Urine: NEGATIVE
Urine Glucose: NEGATIVE
Urobilinogen, UA: 0.2 (ref 0.0–1.0)
pH: 7.5 (ref 5.0–8.0)

## 2020-10-05 ENCOUNTER — Other Ambulatory Visit: Payer: Self-pay | Admitting: Family Medicine

## 2020-10-05 DIAGNOSIS — I10 Essential (primary) hypertension: Secondary | ICD-10-CM

## 2020-11-14 ENCOUNTER — Other Ambulatory Visit: Payer: Self-pay | Admitting: Family Medicine

## 2020-11-14 DIAGNOSIS — E785 Hyperlipidemia, unspecified: Secondary | ICD-10-CM

## 2020-11-15 NOTE — Telephone Encounter (Signed)
Last OV 08/14/20 Last fill 02/08/19  #90/3

## 2020-11-24 ENCOUNTER — Other Ambulatory Visit: Payer: Self-pay | Admitting: Family Medicine

## 2020-11-24 DIAGNOSIS — I10 Essential (primary) hypertension: Secondary | ICD-10-CM

## 2020-12-07 DIAGNOSIS — H35373 Puckering of macula, bilateral: Secondary | ICD-10-CM | POA: Diagnosis not present

## 2020-12-11 DIAGNOSIS — H5213 Myopia, bilateral: Secondary | ICD-10-CM | POA: Diagnosis not present

## 2021-02-14 ENCOUNTER — Ambulatory Visit: Payer: Medicaid Other | Admitting: Family Medicine

## 2021-02-24 ENCOUNTER — Other Ambulatory Visit: Payer: Self-pay | Admitting: Family

## 2021-02-24 DIAGNOSIS — I10 Essential (primary) hypertension: Secondary | ICD-10-CM

## 2021-03-26 ENCOUNTER — Ambulatory Visit: Payer: Medicaid Other | Admitting: Family Medicine

## 2021-04-03 ENCOUNTER — Other Ambulatory Visit: Payer: Self-pay | Admitting: Family Medicine

## 2021-04-03 DIAGNOSIS — I10 Essential (primary) hypertension: Secondary | ICD-10-CM

## 2021-04-24 ENCOUNTER — Other Ambulatory Visit: Payer: Self-pay

## 2021-04-25 ENCOUNTER — Ambulatory Visit (INDEPENDENT_AMBULATORY_CARE_PROVIDER_SITE_OTHER): Payer: Medicaid Other | Admitting: Family Medicine

## 2021-04-25 ENCOUNTER — Encounter: Payer: Self-pay | Admitting: Family Medicine

## 2021-04-25 VITALS — BP 130/72 | HR 63 | Temp 97.4°F | Ht 62.0 in | Wt 140.0 lb

## 2021-04-25 DIAGNOSIS — R7309 Other abnormal glucose: Secondary | ICD-10-CM

## 2021-04-25 DIAGNOSIS — G250 Essential tremor: Secondary | ICD-10-CM | POA: Diagnosis not present

## 2021-04-25 DIAGNOSIS — E876 Hypokalemia: Secondary | ICD-10-CM | POA: Diagnosis not present

## 2021-04-25 DIAGNOSIS — I10 Essential (primary) hypertension: Secondary | ICD-10-CM

## 2021-04-25 DIAGNOSIS — E785 Hyperlipidemia, unspecified: Secondary | ICD-10-CM | POA: Diagnosis not present

## 2021-04-25 DIAGNOSIS — E559 Vitamin D deficiency, unspecified: Secondary | ICD-10-CM

## 2021-04-25 MED ORDER — POTASSIUM CHLORIDE ER 10 MEQ PO TBCR: EXTENDED_RELEASE_TABLET | ORAL | 1 refills | Status: DC

## 2021-04-25 MED ORDER — LOSARTAN POTASSIUM 100 MG PO TABS: ORAL_TABLET | ORAL | 1 refills | Status: DC

## 2021-04-25 MED ORDER — METOPROLOL TARTRATE 100 MG PO TABS: 100.0000 mg | ORAL_TABLET | Freq: Two times a day (BID) | ORAL | 0 refills | Status: DC

## 2021-04-25 MED ORDER — ATORVASTATIN CALCIUM 40 MG PO TABS: 40.0000 mg | ORAL_TABLET | Freq: Every day | ORAL | 3 refills | Status: DC

## 2021-04-25 MED ORDER — HYDROCHLOROTHIAZIDE 25 MG PO TABS: ORAL_TABLET | ORAL | 0 refills | Status: DC

## 2021-04-25 NOTE — Progress Notes (Signed)
Established Patient Office Visit  Subjective:  Patient ID: Michele Rogers, female    DOB: 07/23/1935  Age: 86 y.o. MRN: [id number]  CC:  Chief Complaint  Patient presents with  . Follow-up    Routine follow up on BP and labs, no concerns. Patient not fasting.     HPI Michele ParkMaria O Rogers presents for hypertension, elevated cholesterol, elevated glucose, hypokalemia, and osteoporosis.  Bone density amatory testing in 2019 showed a T score of -3.5 in the lumbar spine.  She continues taking her Fosamax, calcium and vitamin D.  Blood pressures been controlled with losartan, metoprolol and HCTZ.  She takes 10 mEq of potassium for hypokalemia.  Past Medical History:  Diagnosis Date  . Arthritis   . Chest pain   . Headache(784.0)   . Hyperlipidemia   . Hypertension     Past Surgical History:  Procedure Laterality Date  . ABDOMINAL HYSTERECTOMY     complete  . CHOLECYSTECTOMY    . HUMERUS FRACTURE SURGERY     left arm  . VAGINAL DELIVERY     5    Family History  Problem Relation Age of Onset  . Cancer Sister        colon    Social History   Socioeconomic History  . Marital status: Widowed    Spouse name: Not on file  . Number of children: Not on file  . Years of education: Not on file  . Highest education level: Not on file  Occupational History  . Not on file  Tobacco Use  . Smoking status: Never Smoker  . Smokeless tobacco: Never Used  Vaping Use  . Vaping Use: Never used  Substance and Sexual Activity  . Alcohol use: Yes    Alcohol/week: 0.0 standard drinks    Comment: one drink every couple months  . Drug use: No  . Sexual activity: Not on file  Other Topics Concern  . Not on file  Social History Narrative   [place], [place]. Lives with daughter, granddaughter. No pets.    Social Determinants of Health   Financial Resource Strain: Not on file  Food Insecurity: Not on file  Transportation Needs: Not on file  Physical Activity: Not on  file  Stress: Not on file  Social Connections: Not on file  Intimate Partner Violence: Not on file    Outpatient Medications Prior to Visit  Medication Sig Dispense Refill  . alendronate (FOSAMAX) 70 MG tablet TAKE 1 TABLET BY MOUTH ONCE A WEEK. TAKE WITH A FULL GLASS OF WATER ON AN EMPTY STOMACH. 4 tablet 11  . Cholecalciferol 50 MCG (2000 UT) CAPS Take 2,000 Units by mouth daily.    Marland Kitchen. docusate sodium (COLACE) 100 MG capsule Take 1 capsule (100 mg total) by mouth 2 (two) times daily. 60 capsule 0  . Elastic Bandages & Supports (ANKLE BRACE DELUXE LACED/S-M) MISC 1 Units by Does not apply route daily. 1 each 1  . loratadine (CLARITIN) 10 MG tablet Take 1 tablet (10 mg total) by mouth daily. 30 tablet 11  . OS-CAL CALCIUM + D3 500-200 MG-UNIT TABS TOME UNA TABLETA DOS VECES AL D?A 60 tablet 6  . pantoprazole (PROTONIX) 40 MG tablet Take 1 tablet (40 mg total) by mouth daily. 90 tablet 3  . polyethylene glycol powder (GLYCOLAX/MIRALAX) 17 GM/SCOOP powder Take 17 g by mouth daily. 850 g 1  . atorvastatin (LIPITOR) 40 MG tablet TAKE 1 TABLET (40 MG TOTAL) BY MOUTH DAILY AT  6 PM. 90 tablet 3  . hydrochlorothiazide (HYDRODIURIL) 25 MG tablet TOME UNA TABLETA TODOS LOS DIAS 90 tablet 0  . losartan (COZAAR) 100 MG tablet TOME UNA TABLETA TODOS LOS DIAS 90 tablet 1  . metoprolol tartrate (LOPRESSOR) 100 MG tablet Take 1 tablet (100 mg total) by mouth 2 (two) times daily. 180 tablet 0  . potassium chloride (KLOR-CON) 10 MEQ tablet TOME UNA TABLETA TODOS LOS DIAS 90 tablet 1   No facility-administered medications prior to visit.    Allergies  Allergen Reactions  . Lisinopril Cough    ROS Review of Systems  Constitutional: Negative.   HENT: Negative.   Eyes: Negative for photophobia and visual disturbance.  Respiratory: Negative.   Cardiovascular: Negative.   Gastrointestinal: Negative.   Endocrine: Negative for polyphagia and polyuria.  Genitourinary: Negative for dysuria, frequency and  urgency.  Musculoskeletal: Negative for gait problem and joint swelling.  Skin: Negative.   Allergic/Immunologic: Negative for immunocompromised state.  Neurological: Negative for speech difficulty and weakness.  Psychiatric/Behavioral: Negative.       Objective:    Physical Exam Vitals and nursing note reviewed.  Constitutional:      General: She is not in acute distress.    Appearance: Normal appearance. She is normal weight. She is not ill-appearing, toxic-appearing or diaphoretic.  HENT:     Head: Normocephalic and atraumatic.     Right Ear: Tympanic membrane, ear canal and external ear normal.     Left Ear: Tympanic membrane, ear canal and external ear normal.     Mouth/Throat:     Mouth: Mucous membranes are dry.     Pharynx: Oropharynx is clear. No oropharyngeal exudate or posterior oropharyngeal erythema.  Eyes:     General: No scleral icterus.       Right eye: No discharge.        Left eye: No discharge.     Extraocular Movements: Extraocular movements intact.     Conjunctiva/sclera: Conjunctivae normal.     Pupils: Pupils are equal, round, and reactive to light.  Cardiovascular:     Rate and Rhythm: Normal rate and regular rhythm.  Pulmonary:     Effort: Pulmonary effort is normal.     Breath sounds: Normal breath sounds.  Abdominal:     General: Bowel sounds are normal.  Musculoskeletal:     Cervical back: No rigidity or tenderness.  Lymphadenopathy:     Cervical: No cervical adenopathy.  Skin:    General: Skin is warm and dry.  Neurological:     Mental Status: She is alert and oriented to person, place, and time.  Psychiatric:        Mood and Affect: Mood normal.        Behavior: Behavior normal.     BP 130/72   Pulse 63   Temp (!) 97.4 F (36.3 C) (Temporal)   Ht 5\' 2"  (1.575 m)   Wt 140 lb (63.5 kg)   SpO2 97%   BMI 25.61 kg/m  Wt Readings from Last 3 Encounters:  04/25/21 140 lb (63.5 kg)  08/14/20 135 lb 9.6 oz (61.5 kg)  04/18/20 137 lb  (62.1 kg)     Health Maintenance Due  Topic Date Due  . TETANUS/TDAP  Never done  . PNA vac Low Risk Adult (1 of 2 - PCV13) Never done    There are no preventive care reminders to display for this patient.  Lab Results  Component Value Date   TSH 1.65 08/14/2020  Lab Results  Component Value Date   WBC 6.6 08/14/2020   HGB 13.3 08/14/2020   HCT 39.6 08/14/2020   MCV 98.0 08/14/2020   PLT 213.0 08/14/2020   Lab Results  Component Value Date   NA 139 08/14/2020   K 4.8 08/14/2020   CO2 32 08/14/2020   GLUCOSE 105 (H) 08/14/2020   BUN 20 08/14/2020   CREATININE 1.00 08/14/2020   BILITOT 1.2 08/14/2020   ALKPHOS 72 08/14/2020   AST 19 08/14/2020   ALT 13 08/14/2020   PROT 7.6 08/14/2020   ALBUMIN 4.2 08/14/2020   CALCIUM 9.8 08/14/2020   ANIONGAP 8 04/20/2012   GFR 52.98 (L) 08/14/2020   Lab Results  Component Value Date   CHOL 154 08/14/2020   Lab Results  Component Value Date   HDL 52.30 08/14/2020   Lab Results  Component Value Date   LDLCALC 69 08/14/2020   Lab Results  Component Value Date   TRIG 163.0 (H) 08/14/2020   Lab Results  Component Value Date   CHOLHDL 3 08/14/2020   Lab Results  Component Value Date   HGBA1C 6.3 08/14/2020      Assessment & Plan:   Problem List Items Addressed This Visit      Cardiovascular and Mediastinum   Essential hypertension   Relevant Medications   atorvastatin (LIPITOR) 40 MG tablet   hydrochlorothiazide (HYDRODIURIL) 25 MG tablet   losartan (COZAAR) 100 MG tablet   metoprolol tartrate (LOPRESSOR) 100 MG tablet   potassium chloride (KLOR-CON) 10 MEQ tablet   Other Relevant Orders   CBC   Comprehensive metabolic panel   Urinalysis, Routine w reflex microscopic     Nervous and Auditory   Benign head tremor   Relevant Medications   metoprolol tartrate (LOPRESSOR) 100 MG tablet     Other   Hyperlipidemia   Relevant Medications   atorvastatin (LIPITOR) 40 MG tablet   hydrochlorothiazide  (HYDRODIURIL) 25 MG tablet   losartan (COZAAR) 100 MG tablet   metoprolol tartrate (LOPRESSOR) 100 MG tablet   Other Relevant Orders   Comprehensive metabolic panel   LDL cholesterol, direct   Vitamin D deficiency - Primary   Relevant Orders   VITAMIN D 25 Hydroxy (Vit-D Deficiency, Fractures)   Elevated glucose   Relevant Orders   Comprehensive metabolic panel   Hemoglobin A1c    Other Visit Diagnoses    Hypokalemia       Relevant Medications   potassium chloride (KLOR-CON) 10 MEQ tablet      Meds ordered this encounter  Medications  . atorvastatin (LIPITOR) 40 MG tablet    Sig: Take 1 tablet (40 mg total) by mouth daily at 6 PM.    Dispense:  90 tablet    Refill:  3  . hydrochlorothiazide (HYDRODIURIL) 25 MG tablet    Sig: TOME UNA TABLETA TODOS LOS DIAS    Dispense:  90 tablet    Refill:  0  . losartan (COZAAR) 100 MG tablet    Sig: TOME UNA TABLETA TODOS LOS DIAS    Dispense:  90 tablet    Refill:  1  . metoprolol tartrate (LOPRESSOR) 100 MG tablet    Sig: Take 1 tablet (100 mg total) by mouth 2 (two) times daily.    Dispense:  180 tablet    Refill:  0  . potassium chloride (KLOR-CON) 10 MEQ tablet    Sig: Take one tablet daily    Dispense:  90 tablet    Refill:  1    Follow-up: Return in about 6 months (around 10/25/2021).    Mliss Sax, MD

## 2021-04-26 LAB — COMPREHENSIVE METABOLIC PANEL
ALT: 13 U/L (ref 0–35)
AST: 19 U/L (ref 0–37)
Albumin: 4.1 g/dL (ref 3.5–5.2)
Alkaline Phosphatase: 79 U/L (ref 39–117)
BUN: 16 mg/dL (ref 6–23)
CO2: 33 mEq/L — ABNORMAL HIGH (ref 19–32)
Calcium: 9.6 mg/dL (ref 8.4–10.5)
Chloride: 99 mEq/L (ref 96–112)
Creatinine, Ser: 0.93 mg/dL (ref 0.40–1.20)
GFR: 56.85 mL/min — ABNORMAL LOW (ref >60.00)
Glucose, Bld: 108 mg/dL — ABNORMAL HIGH (ref 70–99)
Potassium: 4.6 mEq/L (ref 3.5–5.1)
Sodium: 140 mEq/L (ref 135–145)
Total Bilirubin: 1.2 mg/dL (ref 0.2–1.2)
Total Protein: 7.4 g/dL (ref 6.0–8.3)

## 2021-04-26 LAB — URINALYSIS, ROUTINE W REFLEX MICROSCOPIC
Bilirubin Urine: NEGATIVE
Ketones, ur: NEGATIVE
Nitrite: NEGATIVE
Specific Gravity, Urine: 1.01 (ref 1.000–1.030)
Total Protein, Urine: NEGATIVE
Urine Glucose: NEGATIVE
Urobilinogen, UA: 0.2 (ref 0.0–1.0)
pH: 7.5 (ref 5.0–8.0)

## 2021-04-26 LAB — LDL CHOLESTEROL, DIRECT: Direct LDL: 75 mg/dL

## 2021-04-26 LAB — CBC
HCT: 38.4 % (ref 36.0–46.0)
Hemoglobin: 12.7 g/dL (ref 12.0–15.0)
MCHC: 33 g/dL (ref 30.0–36.0)
MCV: 99.5 fl (ref 78.0–100.0)
Platelets: 238 10*3/uL (ref 150.0–400.0)
RBC: 3.86 Mil/uL — ABNORMAL LOW (ref 3.87–5.11)
RDW: 13.8 % (ref 11.5–15.5)
WBC: 7 10*3/uL (ref 4.0–10.5)

## 2021-04-26 LAB — VITAMIN D 25 HYDROXY (VIT D DEFICIENCY, FRACTURES): VITD: 74.9 ng/mL (ref 30.00–100.00)

## 2021-04-26 LAB — HEMOGLOBIN A1C: Hgb A1c MFr Bld: 6.4 % (ref 4.6–6.5)

## 2021-05-04 IMAGING — DX LEFT TIBIA AND FIBULA - 2 VIEW
2 series · 2 of 2 positions shown · non-contrast
Comparison: Left ankle radiographs-earlier same day

CLINICAL DATA: Left lower leg for the past 4 days following fall.

EXAM:
LEFT TIBIA AND FIBULA - 2 VIEW

[lower leg with ankle ap]
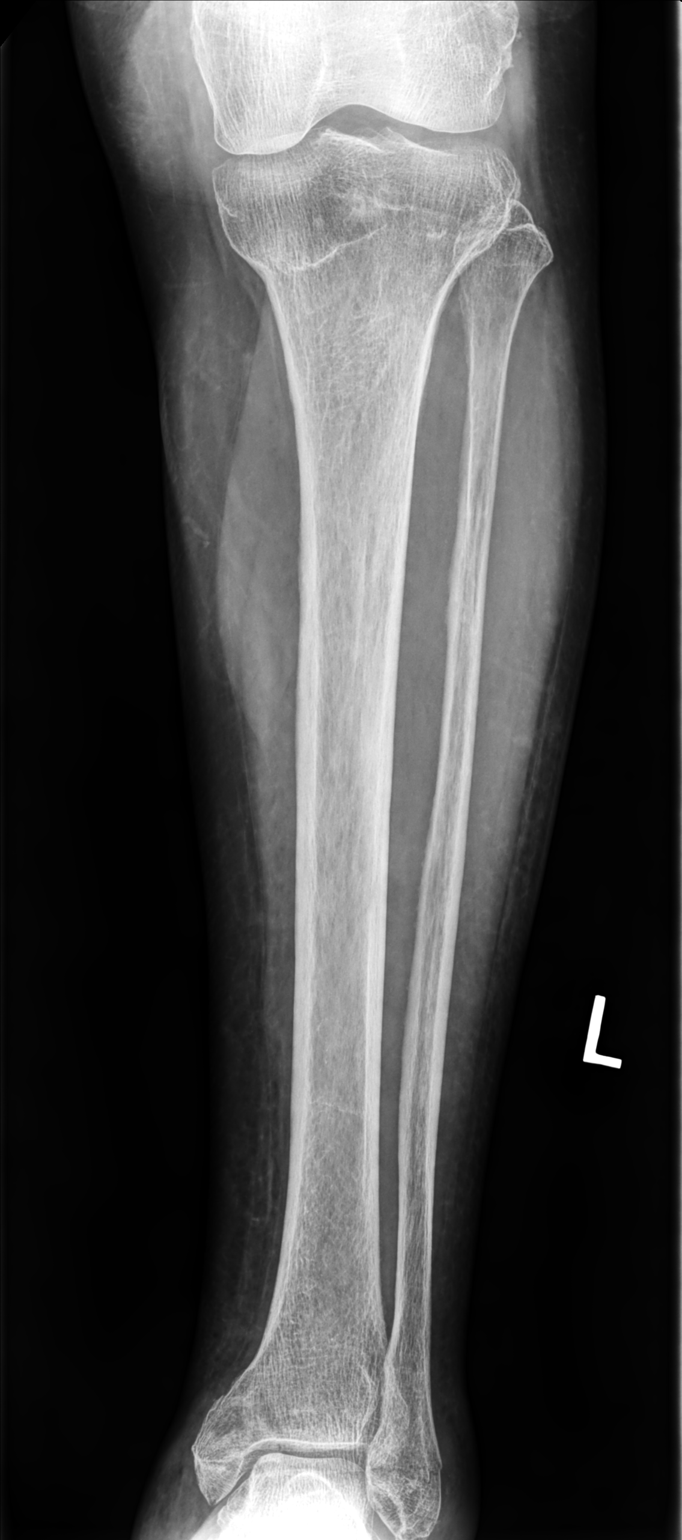

[lower leg with ankle lat]
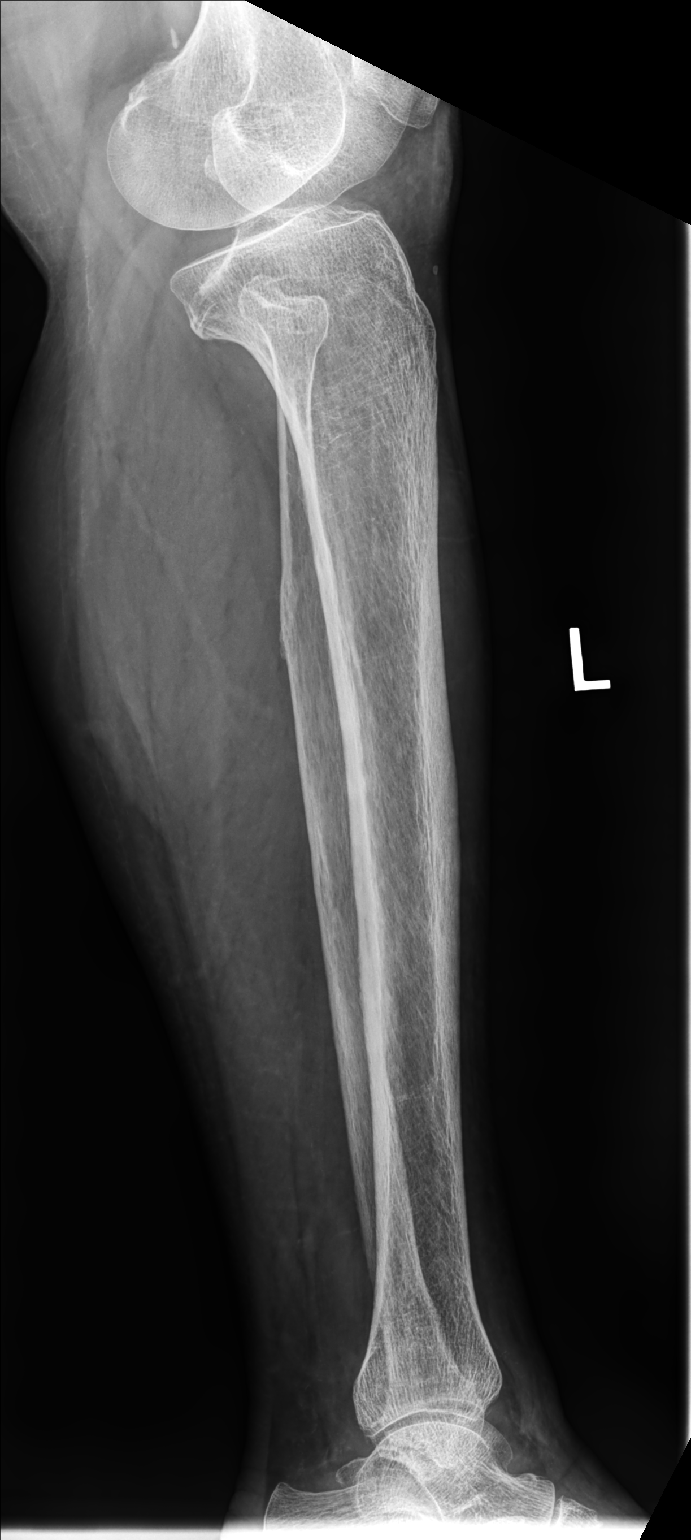

[2 of 2 positions shown; findings below may reference images not displayed]

FINDINGS: There is an acute horizontally oriented nondisplaced fracture
involving the distal fibula with extension to the adjacent tib-fib
joint.

Additionally, there is a suspected nondisplaced fracture involving
the medial malleolus with extension to the ankle mortise and
weight-bearing surface of the distal tibia.

Expected soft tissue swelling about the ankle without associated
radiopaque foreign body.

No additional fractures identified. Limited visualization of the
adjacent knee is normal given obliquity and large field of view.
Presumed dermal calcification about the insertional fibers of the
patellar tendon.
IMPRESSION: Suspected nondisplaced fractures involving the medial and lateral
malleoli, better demonstrated on preceding dedicated ankle
radiographs-earlier same day.

## 2021-05-04 IMAGING — DX LEFT ANKLE COMPLETE - 3+ VIEW
3 series · 3 of 3 positions shown · non-contrast
Comparison: Left tibia and fibular radiographs-earlier same day

CLINICAL DATA: Post fall 4 days ago now with difficulty bearing
weight on left lower leg.

EXAM:
LEFT ANKLE COMPLETE - 3+ VIEW

[ankle ap]
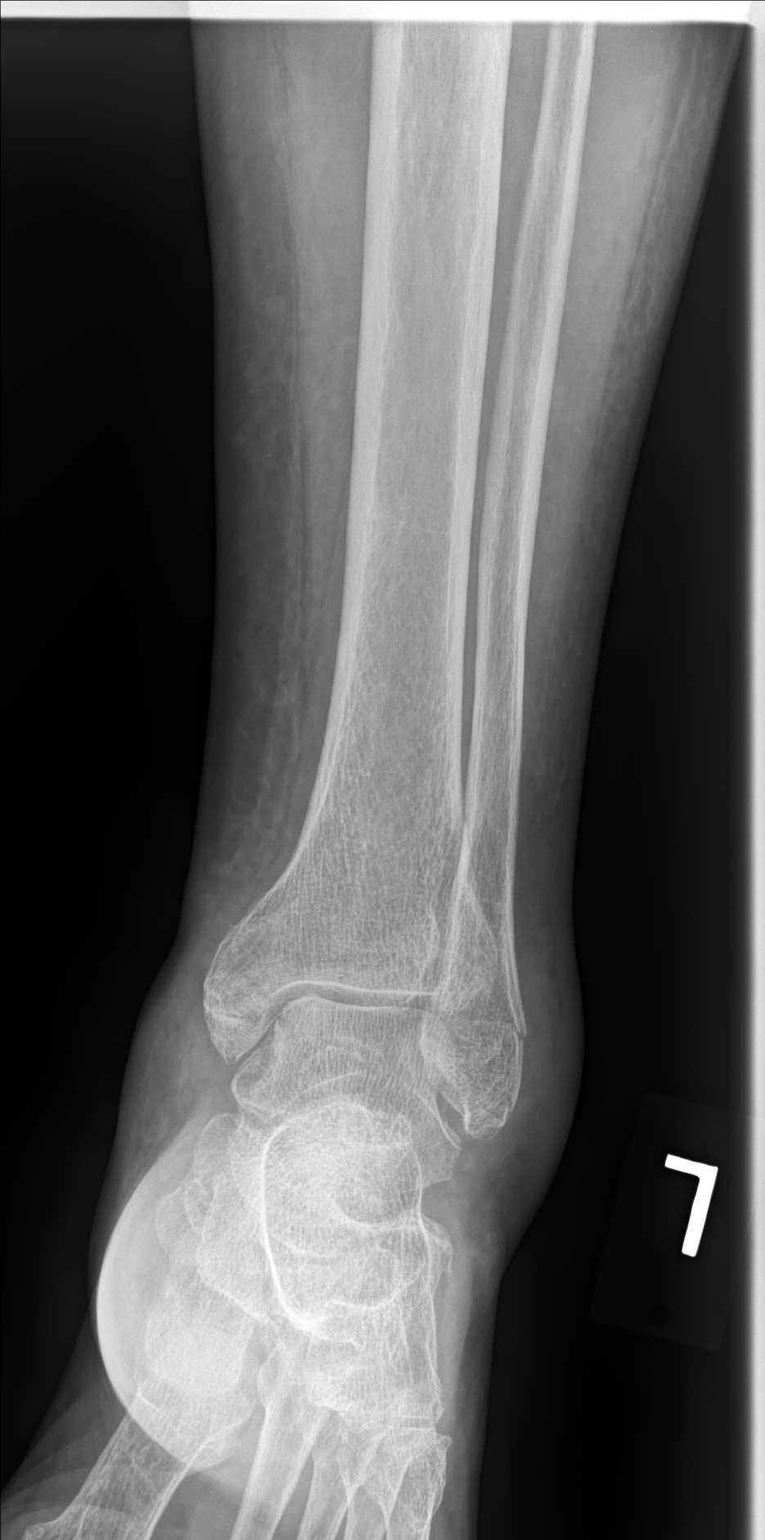

[ankle mlo]
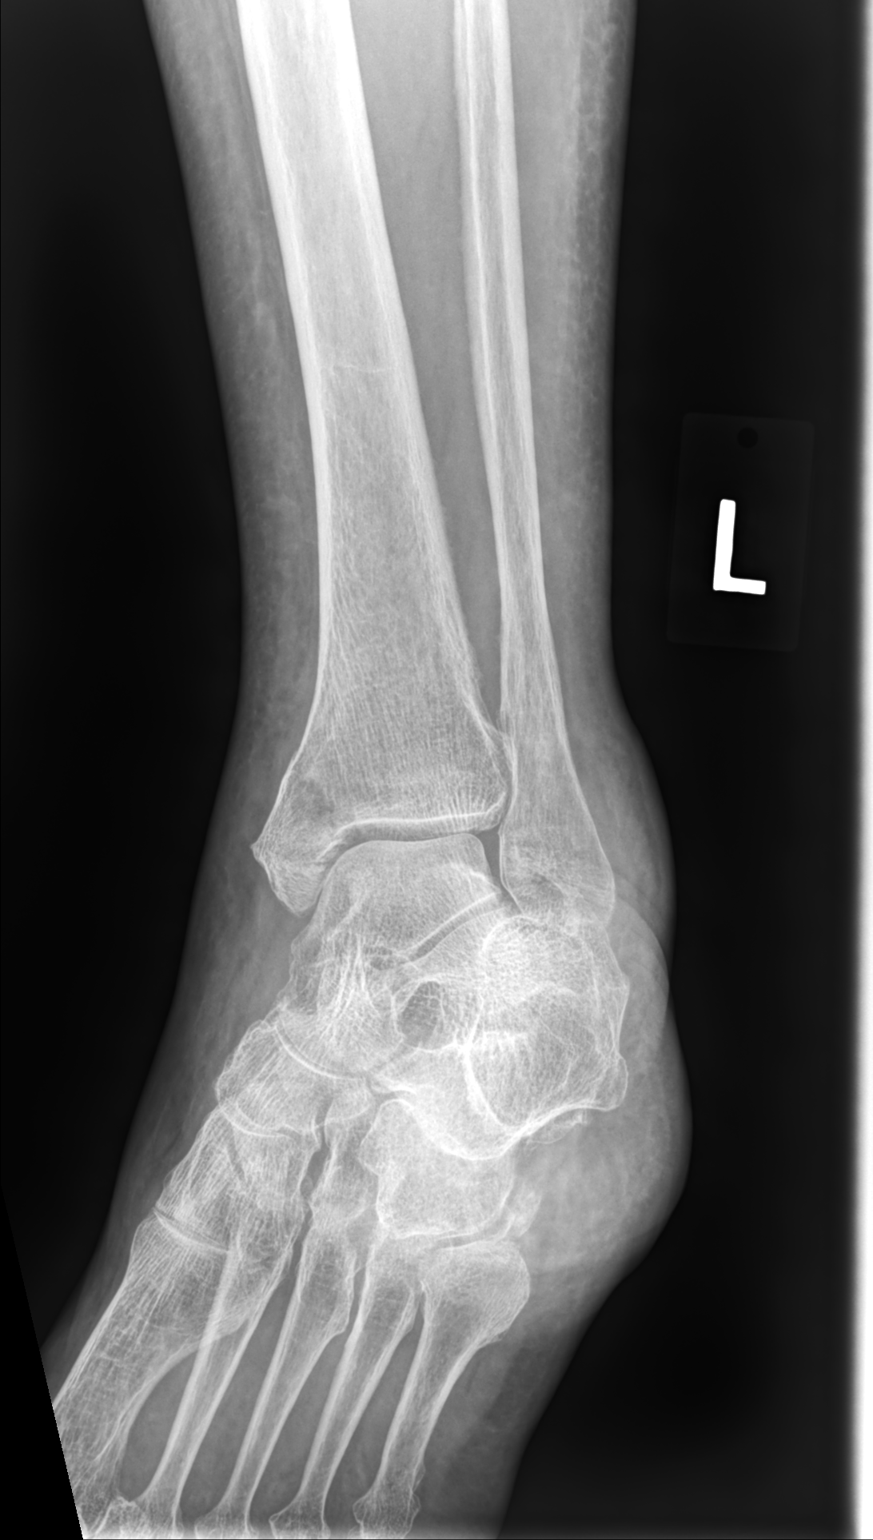

[ankle lat]
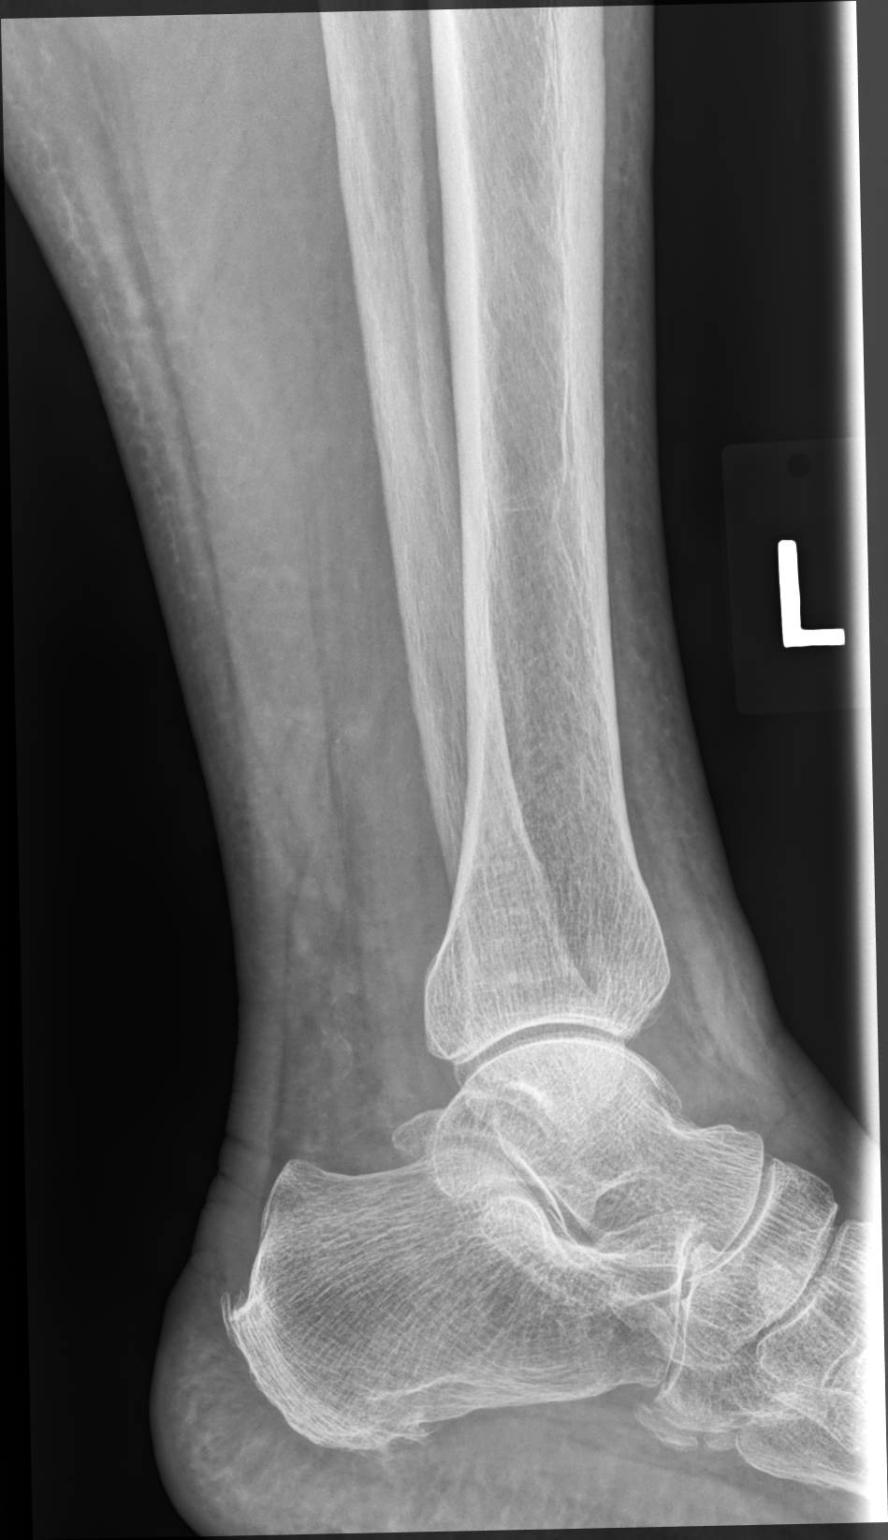

[3 of 3 positions shown; findings below may reference images not displayed]

FINDINGS: There is an acute horizontally oriented nondisplaced fracture
involving the distal fibula with extension to the adjacent tib-fib
joint.

Additionally, there is a obliquely oriented nondisplaced fracture
involving the medial malleolus with extension to the ankle mortise
and weight-bearing surface of the distal tibia.

Additional horizontally oriented lucency involving the more distal
aspect of the medial malleolus with associated adjacent sclerosis
may represent the sequela of remote/healed medial malleolar
fracture.

Expected adjacent soft tissue swelling and small ankle joint
effusion. Note is made of small bipartite os peroneum. Small plantar
calcaneal spur. Enthesopathic change involving the Achilles tendon
insertion site.
IMPRESSION: 1. Acute nondisplaced fractures involving the medial and lateral
malleoli with intra-articular extension, ankle joint effusion and
adjacent soft tissue swelling as detailed above.
2. Additional horizontally oriented lucency involving medial
malleolus is favored to represent sequela of remote remote/healed
fracture.

## 2021-05-09 ENCOUNTER — Telehealth: Payer: Self-pay | Admitting: Family Medicine

## 2021-05-09 NOTE — Telephone Encounter (Signed)
Vernona Rieger called for lab results. Please call tomorrow [phone].

## 2021-05-10 NOTE — Telephone Encounter (Signed)
Returned call

## 2021-05-16 ENCOUNTER — Other Ambulatory Visit: Payer: Self-pay | Admitting: Family Medicine

## 2021-05-16 DIAGNOSIS — M810 Age-related osteoporosis without current pathological fracture: Secondary | ICD-10-CM

## 2021-06-25 ENCOUNTER — Encounter: Payer: Self-pay | Admitting: Family Medicine

## 2021-07-15 ENCOUNTER — Encounter: Payer: Medicaid Other | Admitting: Adult Health

## 2021-09-26 ENCOUNTER — Encounter: Payer: Medicaid Other | Admitting: Adult Health

## 2021-10-10 ENCOUNTER — Other Ambulatory Visit: Payer: Self-pay | Admitting: Family Medicine

## 2021-10-10 DIAGNOSIS — G250 Essential tremor: Secondary | ICD-10-CM

## 2021-10-10 DIAGNOSIS — I10 Essential (primary) hypertension: Secondary | ICD-10-CM

## 2021-10-28 ENCOUNTER — Other Ambulatory Visit: Payer: Self-pay | Admitting: Family Medicine

## 2021-10-28 DIAGNOSIS — I10 Essential (primary) hypertension: Secondary | ICD-10-CM

## 2021-11-15 NOTE — Telephone Encounter (Signed)
Left msg with Grand daughter to schedule pt.

## 2021-11-30 ENCOUNTER — Other Ambulatory Visit: Payer: Self-pay | Admitting: Family Medicine

## 2021-11-30 DIAGNOSIS — G250 Essential tremor: Secondary | ICD-10-CM

## 2021-11-30 DIAGNOSIS — I10 Essential (primary) hypertension: Secondary | ICD-10-CM

## 2021-12-26 ENCOUNTER — Other Ambulatory Visit: Payer: Self-pay | Admitting: Family Medicine

## 2021-12-26 DIAGNOSIS — I10 Essential (primary) hypertension: Secondary | ICD-10-CM

## 2022-01-23 ENCOUNTER — Encounter: Payer: Medicaid Other | Admitting: Family Medicine

## 2022-01-25 ENCOUNTER — Other Ambulatory Visit: Payer: Self-pay | Admitting: Family Medicine

## 2022-01-25 DIAGNOSIS — I10 Essential (primary) hypertension: Secondary | ICD-10-CM

## 2022-01-27 DIAGNOSIS — K5909 Other constipation: Secondary | ICD-10-CM | POA: Diagnosis not present

## 2022-01-27 DIAGNOSIS — R7302 Impaired glucose tolerance (oral): Secondary | ICD-10-CM | POA: Diagnosis not present

## 2022-01-27 DIAGNOSIS — I10 Essential (primary) hypertension: Secondary | ICD-10-CM | POA: Diagnosis not present

## 2022-01-27 DIAGNOSIS — R109 Unspecified abdominal pain: Secondary | ICD-10-CM | POA: Diagnosis not present

## 2022-01-27 DIAGNOSIS — E782 Mixed hyperlipidemia: Secondary | ICD-10-CM | POA: Diagnosis not present

## 2022-01-27 DIAGNOSIS — N390 Urinary tract infection, site not specified: Secondary | ICD-10-CM | POA: Diagnosis not present

## 2022-01-29 ENCOUNTER — Ambulatory Visit: Payer: Medicaid Other | Admitting: Adult Health

## 2022-02-11 DIAGNOSIS — R109 Unspecified abdominal pain: Secondary | ICD-10-CM | POA: Diagnosis not present

## 2022-02-17 DIAGNOSIS — E782 Mixed hyperlipidemia: Secondary | ICD-10-CM | POA: Diagnosis not present

## 2022-02-17 DIAGNOSIS — I493 Ventricular premature depolarization: Secondary | ICD-10-CM | POA: Diagnosis not present

## 2022-02-17 DIAGNOSIS — I10 Essential (primary) hypertension: Secondary | ICD-10-CM | POA: Diagnosis not present

## 2022-02-17 DIAGNOSIS — E119 Type 2 diabetes mellitus without complications: Secondary | ICD-10-CM | POA: Diagnosis not present

## 2022-02-17 DIAGNOSIS — R42 Dizziness and giddiness: Secondary | ICD-10-CM | POA: Diagnosis not present

## 2022-02-27 ENCOUNTER — Other Ambulatory Visit: Payer: Self-pay | Admitting: Family Medicine

## 2022-02-27 DIAGNOSIS — G250 Essential tremor: Secondary | ICD-10-CM

## 2022-02-27 DIAGNOSIS — I10 Essential (primary) hypertension: Secondary | ICD-10-CM

## 2022-03-05 DIAGNOSIS — R42 Dizziness and giddiness: Secondary | ICD-10-CM | POA: Diagnosis not present

## 2022-03-07 DIAGNOSIS — I493 Ventricular premature depolarization: Secondary | ICD-10-CM | POA: Diagnosis not present

## 2022-03-17 DIAGNOSIS — E119 Type 2 diabetes mellitus without complications: Secondary | ICD-10-CM | POA: Diagnosis not present

## 2022-03-17 DIAGNOSIS — E782 Mixed hyperlipidemia: Secondary | ICD-10-CM | POA: Diagnosis not present

## 2022-03-17 DIAGNOSIS — I10 Essential (primary) hypertension: Secondary | ICD-10-CM | POA: Diagnosis not present

## 2022-04-29 DIAGNOSIS — I10 Essential (primary) hypertension: Secondary | ICD-10-CM | POA: Diagnosis not present

## 2022-04-29 DIAGNOSIS — E119 Type 2 diabetes mellitus without complications: Secondary | ICD-10-CM | POA: Diagnosis not present

## 2022-04-29 DIAGNOSIS — E782 Mixed hyperlipidemia: Secondary | ICD-10-CM | POA: Diagnosis not present

## 2022-04-29 DIAGNOSIS — R0789 Other chest pain: Secondary | ICD-10-CM | POA: Diagnosis not present

## 2022-05-25 ENCOUNTER — Other Ambulatory Visit: Payer: Self-pay | Admitting: Family Medicine

## 2022-05-25 DIAGNOSIS — M810 Age-related osteoporosis without current pathological fracture: Secondary | ICD-10-CM

## 2022-05-26 ENCOUNTER — Other Ambulatory Visit: Payer: Self-pay | Admitting: Family Medicine

## 2022-05-26 DIAGNOSIS — E785 Hyperlipidemia, unspecified: Secondary | ICD-10-CM

## 2022-07-04 ENCOUNTER — Other Ambulatory Visit: Payer: Self-pay | Admitting: Family Medicine

## 2022-07-04 DIAGNOSIS — I10 Essential (primary) hypertension: Secondary | ICD-10-CM

## 2022-07-08 ENCOUNTER — Other Ambulatory Visit: Payer: Self-pay | Admitting: Family

## 2022-07-08 DIAGNOSIS — E876 Hypokalemia: Secondary | ICD-10-CM

## 2022-07-08 DIAGNOSIS — I10 Essential (primary) hypertension: Secondary | ICD-10-CM

## 2022-07-31 DIAGNOSIS — I10 Essential (primary) hypertension: Secondary | ICD-10-CM | POA: Diagnosis not present

## 2022-07-31 DIAGNOSIS — E782 Mixed hyperlipidemia: Secondary | ICD-10-CM | POA: Diagnosis not present

## 2022-07-31 DIAGNOSIS — M542 Cervicalgia: Secondary | ICD-10-CM | POA: Diagnosis not present

## 2022-07-31 DIAGNOSIS — R0789 Other chest pain: Secondary | ICD-10-CM | POA: Diagnosis not present

## 2022-07-31 DIAGNOSIS — E119 Type 2 diabetes mellitus without complications: Secondary | ICD-10-CM | POA: Diagnosis not present

## 2022-08-07 DIAGNOSIS — E119 Type 2 diabetes mellitus without complications: Secondary | ICD-10-CM | POA: Diagnosis not present

## 2022-08-07 DIAGNOSIS — I493 Ventricular premature depolarization: Secondary | ICD-10-CM | POA: Diagnosis not present

## 2022-08-07 DIAGNOSIS — E782 Mixed hyperlipidemia: Secondary | ICD-10-CM | POA: Diagnosis not present

## 2022-08-07 DIAGNOSIS — R0789 Other chest pain: Secondary | ICD-10-CM | POA: Diagnosis not present

## 2022-08-07 DIAGNOSIS — I10 Essential (primary) hypertension: Secondary | ICD-10-CM | POA: Diagnosis not present

## 2022-08-14 DIAGNOSIS — R0789 Other chest pain: Secondary | ICD-10-CM | POA: Diagnosis not present

## 2023-03-03 ENCOUNTER — Ambulatory Visit: Payer: Medicaid Other | Admitting: Internal Medicine

## 2023-03-03 ENCOUNTER — Encounter: Payer: Self-pay | Admitting: Internal Medicine

## 2023-03-03 VITALS — BP 128/70 | HR 77 | Ht 60.0 in | Wt 125.4 lb

## 2023-03-03 DIAGNOSIS — E119 Type 2 diabetes mellitus without complications: Secondary | ICD-10-CM

## 2023-03-03 DIAGNOSIS — M810 Age-related osteoporosis without current pathological fracture: Secondary | ICD-10-CM

## 2023-03-03 DIAGNOSIS — E782 Mixed hyperlipidemia: Secondary | ICD-10-CM | POA: Diagnosis not present

## 2023-03-03 DIAGNOSIS — I10 Essential (primary) hypertension: Secondary | ICD-10-CM

## 2023-03-03 LAB — POCT CBG (FASTING - GLUCOSE)-MANUAL ENTRY: Glucose Fasting, POC: 126 mg/dL — AB (ref 70–99)

## 2023-03-03 NOTE — Progress Notes (Signed)
Established Patient Office Visit  Subjective:  Patient ID: Michele Rogers, female    DOB: [birth year]  Age: 88 y.o. MRN: [id number]  Chief Complaint  Patient presents with   Follow-up    Follow up    Patient comes in for her follow-up today.  She has been a little in [place] for all of this time.  She reports that she did well and did not have any problems while there. Today she continues to feel well.  Denies any headaches dizziness or chest pain. She is taking all her medications as prescribed.  Patient will come back fasting for blood work.  Patient has refused colonoscopies and mammograms in the past.  However agrees to get a bone mineral density test.     Past Medical History:  Diagnosis Date   Arthritis    Chest pain    Headache(784.0)    Hyperlipidemia    Hypertension     Past Surgical History:  Procedure Laterality Date   ABDOMINAL HYSTERECTOMY     complete   CHOLECYSTECTOMY     HUMERUS FRACTURE SURGERY     left arm   VAGINAL DELIVERY     5    Social History   Socioeconomic History   Marital status: Widowed    Spouse name: Not on file   Number of children: Not on file   Years of education: Not on file   Highest education level: Not on file  Occupational History   Not on file  Tobacco Use   Smoking status: Never   Smokeless tobacco: Never  Vaping Use   Vaping Use: Never used  Substance and Sexual Activity   Alcohol use: Yes    Alcohol/week: 0.0 standard drinks of alcohol    Comment: one drink every couple months   Drug use: No   Sexual activity: Not on file  Other Topics Concern   Not on file  Social History Narrative   Lives in [place], from [place]. Lives with daughter, granddaughter. No pets.    Social Determinants of Health   Financial Resource Strain: Not on file  Food Insecurity: Not on file  Transportation Needs: Not on file  Physical Activity: Not on file  Stress: Not on file  Social Connections: Not on file  Intimate Partner  Violence: Not on file    Family History  Problem Relation Age of Onset   Cancer Sister        colon    Allergies  Allergen Reactions   Lisinopril Cough    Review of Systems  Constitutional: Negative.   HENT: Negative.    Eyes: Negative.   Respiratory: Negative.    Cardiovascular: Negative.   Gastrointestinal: Negative.   Genitourinary: Negative.   Musculoskeletal: Negative.   Skin: Negative.   Neurological: Negative.   Endo/Heme/Allergies: Negative.   Psychiatric/Behavioral: Negative.         Objective:   BP 128/70   Pulse 77   Ht 5' (1.524 m)   Wt 125 lb 6.4 oz (56.9 kg)   SpO2 98%   BMI 24.49 kg/m   Vitals:   03/03/23 1315  BP: 128/70  Pulse: 77  Height: 5' (1.524 m)  Weight: 125 lb 6.4 oz (56.9 kg)  SpO2: 98%  BMI (Calculated): 24.49    Physical Exam Vitals and nursing note reviewed.  Constitutional:      Appearance: Normal appearance. She is normal weight.  HENT:     Head: Normocephalic.  Cardiovascular:     Rate  and Rhythm: Normal rate and regular rhythm.     Pulses: Normal pulses.     Heart sounds: Normal heart sounds.  Pulmonary:     Effort: Pulmonary effort is normal.     Breath sounds: Normal breath sounds.  Abdominal:     General: Abdomen is flat. Bowel sounds are normal.     Palpations: Abdomen is soft.  Musculoskeletal:        General: Normal range of motion.     Cervical back: Normal range of motion and neck supple.  Skin:    General: Skin is warm and dry.  Neurological:     General: No focal deficit present.     Mental Status: She is alert and oriented to person, place, and time.      Results for orders placed or performed in visit on 03/03/23  POCT CBG (Fasting - Glucose)  Result Value Ref Range   Glucose Fasting, POC 126 (A) 70 - 99 mg/dL    Recent Results (from the past 2160 hour(s))  POCT CBG (Fasting - Glucose)     Status: Abnormal   Collection Time: 03/03/23  1:19 PM  Result Value Ref Range   Glucose Fasting,  POC 126 (A) 70 - 99 mg/dL      Assessment & Plan:   Patient will return fasting for labs. Schedule BMD. Problem List Items Addressed This Visit     Hyperlipidemia   Osteoporosis   Relevant Orders   DG Bone Density   Other Visit Diagnoses     Diabetes mellitus without complication ([place])    -  Primary   Relevant Orders   POCT CBG (Fasting - Glucose) (Completed)   Hemoglobin A1c   Essential hypertension, benign       Relevant Orders   COMPLETE METABOLIC PANEL WITH GFR   CMP14+EGFR       Return in about 6 months (around 09/03/2023).   Total time spent: 30 minutes  Perrin Maltese, MD  03/03/2023

## 2023-03-06 ENCOUNTER — Other Ambulatory Visit: Payer: Medicaid Other

## 2023-03-06 DIAGNOSIS — I10 Essential (primary) hypertension: Secondary | ICD-10-CM | POA: Diagnosis not present

## 2023-03-06 DIAGNOSIS — E119 Type 2 diabetes mellitus without complications: Secondary | ICD-10-CM | POA: Diagnosis not present

## 2023-03-07 LAB — HEMOGLOBIN A1C
Est. average glucose Bld gHb Est-mCnc: 134 mg/dL
Hgb A1c MFr Bld: 6.3 % — ABNORMAL HIGH (ref 4.8–5.6)

## 2023-03-07 LAB — CMP14+EGFR
ALT: 16 IU/L (ref 0–32)
AST: 23 IU/L (ref 0–40)
Albumin/Globulin Ratio: 1.5 (ref 1.2–2.2)
Albumin: 4.1 g/dL (ref 3.7–4.7)
Alkaline Phosphatase: 75 IU/L (ref 44–121)
BUN/Creatinine Ratio: 14 (ref 12–28)
BUN: 14 mg/dL (ref 8–27)
Bilirubin Total: 1.5 mg/dL — ABNORMAL HIGH (ref 0.0–1.2)
CO2: 27 mmol/L (ref 20–29)
Calcium: 9.5 mg/dL (ref 8.7–10.3)
Chloride: 97 mmol/L (ref 96–106)
Creatinine, Ser: 0.97 mg/dL (ref 0.57–1.00)
Globulin, Total: 2.8 g/dL (ref 1.5–4.5)
Glucose: 97 mg/dL (ref 70–99)
Potassium: 4.9 mmol/L (ref 3.5–5.2)
Sodium: 137 mmol/L (ref 134–144)
Total Protein: 6.9 g/dL (ref 6.0–8.5)
eGFR: 57 mL/min/{1.73_m2} — ABNORMAL LOW (ref >59)

## 2023-05-05 ENCOUNTER — Ambulatory Visit (INDEPENDENT_AMBULATORY_CARE_PROVIDER_SITE_OTHER): Payer: Medicaid Other

## 2023-05-05 ENCOUNTER — Ambulatory Visit: Payer: Medicaid Other | Admitting: Internal Medicine

## 2023-05-05 VITALS — BP 120/68 | HR 87 | Ht 60.0 in | Wt 128.0 lb

## 2023-05-05 DIAGNOSIS — I10 Essential (primary) hypertension: Secondary | ICD-10-CM

## 2023-05-05 DIAGNOSIS — M25561 Pain in right knee: Secondary | ICD-10-CM | POA: Diagnosis not present

## 2023-05-05 DIAGNOSIS — G8929 Other chronic pain: Secondary | ICD-10-CM

## 2023-05-05 DIAGNOSIS — M549 Dorsalgia, unspecified: Secondary | ICD-10-CM

## 2023-05-05 MED ORDER — MELOXICAM 7.5 MG PO TABS: 7.5000 mg | ORAL_TABLET | Freq: Two times a day (BID) | ORAL | 0 refills | Status: DC

## 2023-05-07 ENCOUNTER — Encounter: Payer: Self-pay | Admitting: Internal Medicine

## 2023-05-07 DIAGNOSIS — M25561 Pain in right knee: Secondary | ICD-10-CM | POA: Insufficient documentation

## 2023-05-07 NOTE — Progress Notes (Signed)
Established Patient Office Visit  Subjective:  Patient ID: Michele Rogers, female    DOB: [birth year]  Age: 88 y.o. MRN: [id number]  Chief Complaint  Patient presents with   Follow-up    Right knee pain    Patient comes in for an acute visit with complaints of worsening right knee pain over the last few months.  Now she feels morning stiffness and has some difficulty with weightbearing.  She feels some tenderness along the medial side of right knee. No other complaints. Will check x-ray of the right knee, and start p.o. meloxicam 7.5 mg twice a day to see response. No other complaints today.    No other concerns at this time.   Past Medical History:  Diagnosis Date   Arthritis    Chest pain    Headache(784.0)    Hyperlipidemia    Hypertension     Past Surgical History:  Procedure Laterality Date   ABDOMINAL HYSTERECTOMY     complete   CHOLECYSTECTOMY     HUMERUS FRACTURE SURGERY     left arm   VAGINAL DELIVERY     5    Social History   Socioeconomic History   Marital status: Widowed    Spouse name: Not on file   Number of children: Not on file   Years of education: Not on file   Highest education level: Not on file  Occupational History   Not on file  Tobacco Use   Smoking status: Never   Smokeless tobacco: Never  Vaping Use   Vaping Use: Never used  Substance and Sexual Activity   Alcohol use: Yes    Alcohol/week: 0.0 standard drinks of alcohol    Comment: one drink every couple months   Drug use: No   Sexual activity: Not on file  Other Topics Concern   Not on file  Social History Narrative   Lives in [place], from [place]. Lives with daughter, granddaughter. No pets.    Social Determinants of Health   Financial Resource Strain: Not on file  Food Insecurity: Not on file  Transportation Needs: Not on file  Physical Activity: Not on file  Stress: Not on file  Social Connections: Not on file  Intimate Partner Violence: Not on file     Family History  Problem Relation Age of Onset   Cancer Sister        colon    Allergies  Allergen Reactions   Lisinopril Cough    Review of Systems  Constitutional: Negative.  Negative for chills, diaphoresis, fever, malaise/fatigue and weight loss.  HENT: Negative.    Eyes: Negative.   Respiratory: Negative.    Cardiovascular: Negative.   Gastrointestinal: Negative.   Genitourinary: Negative.   Musculoskeletal:  Positive for joint pain. Negative for myalgias.  Skin: Negative.   Neurological: Negative.   Psychiatric/Behavioral: Negative.         Objective:   BP 120/68   Pulse 87   Ht 5' (1.524 m)   Wt 128 lb (58.1 kg)   SpO2 98%   BMI 25.00 kg/m   Vitals:   05/05/23 0937  BP: 120/68  Pulse: 87  Height: 5' (1.524 m)  Weight: 128 lb (58.1 kg)  SpO2: 98%  BMI (Calculated): 25    Physical Exam Vitals and nursing note reviewed.  Constitutional:      Appearance: Normal appearance. She is normal weight.  Cardiovascular:     Rate and Rhythm: Normal rate and regular rhythm.  Pulses: Normal pulses.     Heart sounds: Normal heart sounds.  Pulmonary:     Effort: Pulmonary effort is normal.     Breath sounds: Normal breath sounds. No wheezing, rhonchi or rales.  Abdominal:     General: Bowel sounds are normal. There is no distension.     Palpations: Abdomen is soft. There is no mass.     Tenderness: There is no abdominal tenderness. There is no guarding or rebound.     Hernia: No hernia is present.  Musculoskeletal:        General: Swelling (right knee) and tenderness (mild swelling along media side.) present.     Cervical back: Normal range of motion and neck supple.     Right lower leg: No edema.     Left lower leg: No edema.  Skin:    General: Skin is warm.  Neurological:     General: No focal deficit present.     Mental Status: She is alert and oriented to person, place, and time.  Psychiatric:        Mood and Affect: Mood normal.         Behavior: Behavior normal.      No results found for any visits on 05/05/23.      Assessment & Plan:  Right knee pain, suspect osteoarthritis.  Check x-ray of the right knee. Trial of Mobic low-dose.  May need orthopedic appointment. Problem List Items Addressed This Visit     Essential hypertension   Mid back pain, chronic   Relevant Medications   meloxicam (MOBIC) 7.5 MG tablet   Right knee pain - Primary   Relevant Medications   meloxicam (MOBIC) 7.5 MG tablet   Other Relevant Orders   DG KNEE 3 VIEW RIGHT (Completed)    Return in about 10 days (around 05/15/2023).   Total time spent: 25 minutes  Margaretann Loveless, MD  05/05/2023

## 2023-05-15 ENCOUNTER — Encounter: Payer: Self-pay | Admitting: Internal Medicine

## 2023-05-15 ENCOUNTER — Ambulatory Visit: Payer: Medicaid Other | Admitting: Internal Medicine

## 2023-05-15 VITALS — BP 135/80 | HR 83 | Ht 62.0 in | Wt 130.8 lb

## 2023-05-15 DIAGNOSIS — E119 Type 2 diabetes mellitus without complications: Secondary | ICD-10-CM | POA: Diagnosis not present

## 2023-05-15 DIAGNOSIS — I10 Essential (primary) hypertension: Secondary | ICD-10-CM

## 2023-05-15 DIAGNOSIS — M1711 Unilateral primary osteoarthritis, right knee: Secondary | ICD-10-CM

## 2023-05-15 LAB — GLUCOSE, POCT (MANUAL RESULT ENTRY): POC Glucose: 124 mg/dl — AB (ref 70–99)

## 2023-05-15 NOTE — Progress Notes (Signed)
Established Patient Office Visit  Subjective:  Patient ID: Michele Rogers, female    DOB: Jun 16, 1935  Age: 88 y.o. MRN: [id number]  Chief Complaint  Patient presents with   Follow-up    10 day follow up    Patient returns today for a follow-up on her right knee pain.  She was having this discomfort for quite some time.  An x-ray was done which showed mild osteoarthritis.  Patient was started on meloxicam 7.5 mg tablet 1 p.o. twice daily.  Today patient is feeling much better.  She does not have any further complaints of right knee joint pain.  Patient advised to cut down meloxicam now to once a day and then stop after 2 weeks completely.  Thereafter she can take it as needed.    No other concerns at this time.   Past Medical History:  Diagnosis Date   Arthritis    Chest pain    Headache(784.0)    Hyperlipidemia    Hypertension     Past Surgical History:  Procedure Laterality Date   ABDOMINAL HYSTERECTOMY     complete   CHOLECYSTECTOMY     HUMERUS FRACTURE SURGERY     left arm   VAGINAL DELIVERY     5    Social History   Socioeconomic History   Marital status: Widowed    Spouse name: Not on file   Number of children: Not on file   Years of education: Not on file   Highest education level: Not on file  Occupational History   Not on file  Tobacco Use   Smoking status: Never   Smokeless tobacco: Never  Vaping Use   Vaping Use: Never used  Substance and Sexual Activity   Alcohol use: Yes    Alcohol/week: 0.0 standard drinks of alcohol    Comment: one drink every couple months   Drug use: No   Sexual activity: Not on file  Other Topics Concern   Not on file  Social History Narrative   Lives in [place], from [place]. Lives with daughter, granddaughter. No pets.    Social Determinants of Health   Financial Resource Strain: Not on file  Food Insecurity: Not on file  Transportation Needs: Not on file  Physical Activity: Not on file  Stress: Not on file   Social Connections: Not on file  Intimate Partner Violence: Not on file    Family History  Problem Relation Age of Onset   Cancer Sister        colon    Allergies  Allergen Reactions   Lisinopril Cough    Review of Systems  Constitutional:  Negative for chills, fever, malaise/fatigue and weight loss.  HENT: Negative.    Eyes: Negative.   Respiratory:  Negative for cough, shortness of breath and wheezing.   Cardiovascular:  Negative for chest pain, palpitations and leg swelling.  Gastrointestinal:  Negative for abdominal pain, blood in stool, diarrhea, heartburn, melena, nausea and vomiting.  Genitourinary: Negative.   Musculoskeletal:  Negative for back pain, falls, joint pain, myalgias and neck pain.  Skin:  Negative for itching and rash.  Neurological: Negative.   Psychiatric/Behavioral: Negative.         Objective:   BP 135/80   Pulse 83   Ht 5\' 2"  (1.575 m)   Wt 130 lb 12.8 oz (59.3 kg)   SpO2 96%   BMI 23.92 kg/m   Vitals:   05/15/23 0943  BP: 135/80  Pulse: 83  Height:  5\' 2"  (1.575 m)  Weight: 130 lb 12.8 oz (59.3 kg)  SpO2: 96%  BMI (Calculated): 23.92    Physical Exam Vitals and nursing note reviewed.  Constitutional:      Appearance: Normal appearance.  Cardiovascular:     Rate and Rhythm: Normal rate and regular rhythm.     Pulses: Normal pulses.     Heart sounds: Normal heart sounds.  Pulmonary:     Effort: Pulmonary effort is normal.     Breath sounds: Normal breath sounds. No wheezing or rales.  Abdominal:     General: Bowel sounds are normal. There is no distension.     Palpations: Abdomen is soft.     Tenderness: There is no abdominal tenderness.  Musculoskeletal:        General: No swelling or tenderness. Normal range of motion.     Cervical back: Normal range of motion.     Right lower leg: No edema.     Left lower leg: No edema.  Skin:    General: Skin is warm and dry.  Neurological:     Mental Status: She is alert and  oriented to person, place, and time. Mental status is at baseline.  Psychiatric:        Mood and Affect: Mood normal.        Behavior: Behavior normal.      Results for orders placed or performed in visit on 05/15/23  POCT Glucose (CBG)  Result Value Ref Range   POC Glucose 124 (A) 70 - 99 mg/dl    Recent Results (from the past 2160 hour(s))  POCT CBG (Fasting - Glucose)     Status: Abnormal   Collection Time: 03/03/23  1:19 PM  Result Value Ref Range   Glucose Fasting, POC 126 (A) 70 - 99 mg/dL  [id number]     Status: Abnormal   Collection Time: 03/06/23  8:50 AM  Result Value Ref Range   Glucose 97 70 - 99 mg/dL   BUN 14 8 - 27 mg/dL   Creatinine, Ser 4.09 0.57 - 1.00 mg/dL   eGFR 57 (L) >81 XB/JYN/8.29   BUN/Creatinine Ratio 14 12 - 28   Sodium 137 134 - 144 mmol/L   Potassium 4.9 3.5 - 5.2 mmol/L   Chloride 97 96 - 106 mmol/L   CO2 27 20 - 29 mmol/L   Calcium 9.5 8.7 - 10.3 mg/dL   Total Protein 6.9 6.0 - 8.5 g/dL   Albumin 4.1 3.7 - 4.7 g/dL   Globulin, Total 2.8 1.5 - 4.5 g/dL   Albumin/Globulin Ratio 1.5 1.2 - 2.2   Bilirubin Total 1.5 (H) 0.0 - 1.2 mg/dL   Alkaline Phosphatase 75 44 - 121 IU/L   AST 23 0 - 40 IU/L   ALT 16 0 - 32 IU/L  Hemoglobin A1c     Status: Abnormal   Collection Time: 03/06/23  8:50 AM  Result Value Ref Range   Hgb A1c MFr Bld 6.3 (H) 4.8 - 5.6 %    Comment:          Prediabetes: 5.7 - 6.4          Diabetes: >6.4          Glycemic control for adults with diabetes: <7.0    Est. average glucose Bld gHb Est-mCnc 134 mg/dL  POCT Glucose (CBG)     Status: Abnormal   Collection Time: 05/15/23  9:52 AM  Result Value Ref Range   POC Glucose 124 (A) 70 -  99 mg/dl      Assessment & Plan:  Patient advised to continue taking meloxicam once a day for few more days and then stop to be taken only as needed.  She can continue the other medications as such. Problem List Items Addressed This Visit     Essential hypertension, benign   Primary  osteoarthritis of right knee - Primary   Type 2 diabetes mellitus without complication, without long-term current use of insulin (HCC)   Relevant Medications   metFORMIN (GLUCOPHAGE-XR) 500 MG 24 hr tablet   Other Relevant Orders   POCT Glucose (CBG) (Completed)    Follow up as scheduled.   Total time spent: 25 minutes  Margaretann Loveless, MD  05/15/2023   This document may have been prepared by [facility] Voice Recognition software and as such may include unintentional dictation errors.

## 2023-05-30 ENCOUNTER — Other Ambulatory Visit: Payer: Self-pay | Admitting: Internal Medicine

## 2023-06-02 ENCOUNTER — Other Ambulatory Visit: Payer: Self-pay | Admitting: Internal Medicine

## 2023-06-02 DIAGNOSIS — M25561 Pain in right knee: Secondary | ICD-10-CM

## 2023-06-25 ENCOUNTER — Ambulatory Visit: Payer: Medicaid Other | Admitting: Internal Medicine

## 2023-06-30 ENCOUNTER — Ambulatory Visit: Payer: Medicaid Other | Admitting: Internal Medicine

## 2023-07-03 ENCOUNTER — Other Ambulatory Visit: Payer: Self-pay | Admitting: Internal Medicine

## 2023-07-03 DIAGNOSIS — M810 Age-related osteoporosis without current pathological fracture: Secondary | ICD-10-CM

## 2023-07-03 DIAGNOSIS — M25561 Pain in right knee: Secondary | ICD-10-CM

## 2023-07-06 ENCOUNTER — Ambulatory Visit: Payer: Medicaid Other | Admitting: Internal Medicine

## 2023-07-09 ENCOUNTER — Other Ambulatory Visit: Payer: Self-pay | Admitting: Internal Medicine

## 2023-07-09 DIAGNOSIS — E876 Hypokalemia: Secondary | ICD-10-CM

## 2023-07-09 DIAGNOSIS — E785 Hyperlipidemia, unspecified: Secondary | ICD-10-CM

## 2023-07-09 DIAGNOSIS — I10 Essential (primary) hypertension: Secondary | ICD-10-CM

## 2023-08-03 ENCOUNTER — Other Ambulatory Visit: Payer: Self-pay | Admitting: Internal Medicine

## 2023-08-03 DIAGNOSIS — I10 Essential (primary) hypertension: Secondary | ICD-10-CM

## 2023-08-03 DIAGNOSIS — G250 Essential tremor: Secondary | ICD-10-CM

## 2023-08-03 DIAGNOSIS — M25561 Pain in right knee: Secondary | ICD-10-CM

## 2023-09-01 ENCOUNTER — Other Ambulatory Visit: Payer: Self-pay | Admitting: Internal Medicine

## 2023-09-01 DIAGNOSIS — M25561 Pain in right knee: Secondary | ICD-10-CM

## 2023-09-03 ENCOUNTER — Ambulatory Visit: Payer: Medicaid Other | Admitting: Internal Medicine

## 2023-09-08 ENCOUNTER — Encounter: Payer: Self-pay | Admitting: Internal Medicine

## 2023-09-08 ENCOUNTER — Ambulatory Visit: Payer: Medicaid Other | Admitting: Internal Medicine

## 2023-09-08 VITALS — BP 152/84 | HR 61 | Ht 60.0 in | Wt 130.8 lb

## 2023-09-08 DIAGNOSIS — E782 Mixed hyperlipidemia: Secondary | ICD-10-CM | POA: Diagnosis not present

## 2023-09-08 DIAGNOSIS — I10 Essential (primary) hypertension: Secondary | ICD-10-CM | POA: Diagnosis not present

## 2023-09-08 DIAGNOSIS — E119 Type 2 diabetes mellitus without complications: Secondary | ICD-10-CM

## 2023-09-08 LAB — POCT CBG (FASTING - GLUCOSE)-MANUAL ENTRY: Glucose Fasting, POC: 136 mg/dL — AB (ref 70–99)

## 2023-09-08 NOTE — Progress Notes (Signed)
Established Patient Office Visit  Subjective:  Patient ID: Michele Rogers, female    DOB: 1935-10-05  Age: 88 y.o. MRN: [id number]  Chief Complaint  Patient presents with   Follow-up    6 month follow up    Patient comes in for her follow-up today.  She is accompanied by family member who helps to translate. Her blood pressure is high today as she did not eat or take her blood pressure medications yet.  Will get fasting blood work now. Patient has no further pain in her knee and generally feels very well.    No other concerns at this time.   Past Medical History:  Diagnosis Date   Arthritis    Chest pain    Headache(784.0)    Hyperlipidemia    Hypertension     Past Surgical History:  Procedure Laterality Date   ABDOMINAL HYSTERECTOMY     complete   CHOLECYSTECTOMY     HUMERUS FRACTURE SURGERY     left arm   VAGINAL DELIVERY     5    Social History   Socioeconomic History   Marital status: Widowed    Spouse name: Not on file   Number of children: Not on file   Years of education: Not on file   Highest education level: Not on file  Occupational History   Not on file  Tobacco Use   Smoking status: Never   Smokeless tobacco: Never  Vaping Use   Vaping status: Never Used  Substance and Sexual Activity   Alcohol use: Yes    Alcohol/week: 0.0 standard drinks of alcohol    Comment: one drink every couple months   Drug use: No   Sexual activity: Not on file  Other Topics Concern   Not on file  Social History Narrative   Lives in [place], from [place]. Lives with daughter, granddaughter. No pets.    Social Determinants of Health   Financial Resource Strain: Not on file  Food Insecurity: Not on file  Transportation Needs: Not on file  Physical Activity: Not on file  Stress: Not on file  Social Connections: Not on file  Intimate Partner Violence: Not on file    Family History  Problem Relation Age of Onset   Cancer Sister        colon     Allergies  Allergen Reactions   Lisinopril Cough    Review of Systems  Constitutional: Negative.  Negative for chills, fever, malaise/fatigue and weight loss.  HENT: Negative.  Negative for hearing loss and sore throat.   Eyes: Negative.   Respiratory: Negative.  Negative for cough and shortness of breath.   Cardiovascular: Negative.  Negative for chest pain, palpitations and leg swelling.  Gastrointestinal: Negative.  Negative for abdominal pain, blood in stool, constipation, diarrhea, heartburn, nausea and vomiting.  Genitourinary: Negative.  Negative for dysuria and flank pain.  Musculoskeletal: Negative.  Negative for back pain, joint pain, myalgias and neck pain.  Skin: Negative.   Neurological: Negative.  Negative for dizziness and headaches.  Endo/Heme/Allergies: Negative.   Psychiatric/Behavioral: Negative.  Negative for depression and suicidal ideas. The patient is not nervous/anxious.        Objective:   BP (!) 152/84   Pulse 61   Ht 5' (1.524 m)   Wt 130 lb 12.8 oz (59.3 kg)   SpO2 97%   BMI 25.55 kg/m   Vitals:   09/08/23 1333  BP: (!) 152/84  Pulse: 61  Height: 5' (  1.524 m)  Weight: 130 lb 12.8 oz (59.3 kg)  SpO2: 97%  BMI (Calculated): 25.54    Physical Exam Vitals and nursing note reviewed.  Constitutional:      Appearance: Normal appearance.  HENT:     Head: Normocephalic and atraumatic.     Nose: Nose normal.     Mouth/Throat:     Mouth: Mucous membranes are moist.     Pharynx: Oropharynx is clear.  Eyes:     Conjunctiva/sclera: Conjunctivae normal.     Pupils: Pupils are equal, round, and reactive to light.  Cardiovascular:     Rate and Rhythm: Normal rate and regular rhythm.     Pulses: Normal pulses.     Heart sounds: Normal heart sounds. No murmur heard. Pulmonary:     Effort: Pulmonary effort is normal.     Breath sounds: Normal breath sounds. No wheezing.  Abdominal:     General: Bowel sounds are normal.     Palpations:  Abdomen is soft.     Tenderness: There is no abdominal tenderness. There is no right CVA tenderness or left CVA tenderness.  Musculoskeletal:        General: Normal range of motion.     Cervical back: Normal range of motion.     Right lower leg: No edema.     Left lower leg: No edema.  Skin:    General: Skin is warm and dry.  Neurological:     General: No focal deficit present.     Mental Status: She is alert and oriented to person, place, and time.  Psychiatric:        Mood and Affect: Mood normal.        Behavior: Behavior normal.      Results for orders placed or performed in visit on 09/08/23  POCT CBG (Fasting - Glucose)  Result Value Ref Range   Glucose Fasting, POC 136 (A) 70 - 99 mg/dL    Recent Results (from the past 2160 hour(s))  POCT CBG (Fasting - Glucose)     Status: Abnormal   Collection Time: 09/08/23  1:39 PM  Result Value Ref Range   Glucose Fasting, POC 136 (A) 70 - 99 mg/dL      Assessment & Plan:  Patient advised to take her medications on time.  Will return in 2 weeks so we can see blood pressure control. Check labs today. Problem List Items Addressed This Visit     Hyperlipidemia   Relevant Orders   Lipid Panel w/o Chol/HDL Ratio   Essential hypertension, benign   Relevant Orders   CMP14+EGFR   Type 2 diabetes mellitus without complication, without long-term current use of insulin (HCC) - Primary   Relevant Orders   POCT CBG (Fasting - Glucose) (Completed)   Hemoglobin A1c    Return in about 2 weeks (around 09/22/2023).   Total time spent: 30 minutes  Margaretann Loveless, MD  09/08/2023   This document may have been prepared by [facility] Voice Recognition software and as such may include unintentional dictation errors.

## 2023-09-09 LAB — CMP14+EGFR
ALT: 16 IU/L (ref 0–32)
AST: 22 IU/L (ref 0–40)
Albumin: 4.4 g/dL (ref 3.7–4.7)
Alkaline Phosphatase: 80 IU/L (ref 44–121)
BUN/Creatinine Ratio: 17 (ref 12–28)
BUN: 16 mg/dL (ref 8–27)
Bilirubin Total: 1.3 mg/dL — ABNORMAL HIGH (ref 0.0–1.2)
CO2: 27 mmol/L (ref 20–29)
Calcium: 9.2 mg/dL (ref 8.7–10.3)
Chloride: 99 mmol/L (ref 96–106)
Creatinine, Ser: 0.96 mg/dL (ref 0.57–1.00)
Globulin, Total: 2.9 g/dL (ref 1.5–4.5)
Glucose: 112 mg/dL — ABNORMAL HIGH (ref 70–99)
Potassium: 4.7 mmol/L (ref 3.5–5.2)
Sodium: 141 mmol/L (ref 134–144)
Total Protein: 7.3 g/dL (ref 6.0–8.5)
eGFR: 58 mL/min/{1.73_m2} — ABNORMAL LOW (ref >59)

## 2023-09-09 LAB — LIPID PANEL W/O CHOL/HDL RATIO
Cholesterol, Total: 147 mg/dL (ref 100–199)
HDL: 59 mg/dL
LDL Chol Calc (NIH): 64 mg/dL (ref 0–99)
Triglycerides: 139 mg/dL (ref 0–149)
VLDL Cholesterol Cal: 24 mg/dL (ref 5–40)

## 2023-09-09 LAB — HEMOGLOBIN A1C
Est. average glucose Bld gHb Est-mCnc: 131 mg/dL
Hgb A1c MFr Bld: 6.2 % — ABNORMAL HIGH (ref 4.8–5.6)

## 2023-09-09 NOTE — Progress Notes (Signed)
Patient notified

## 2023-09-22 ENCOUNTER — Ambulatory Visit: Payer: Medicaid Other | Admitting: Internal Medicine

## 2023-09-22 ENCOUNTER — Encounter: Payer: Self-pay | Admitting: Internal Medicine

## 2023-09-22 VITALS — BP 190/90 | HR 74 | Ht 60.0 in | Wt 133.2 lb

## 2023-09-22 DIAGNOSIS — E782 Mixed hyperlipidemia: Secondary | ICD-10-CM | POA: Diagnosis not present

## 2023-09-22 DIAGNOSIS — I10 Essential (primary) hypertension: Secondary | ICD-10-CM | POA: Diagnosis not present

## 2023-09-22 DIAGNOSIS — E119 Type 2 diabetes mellitus without complications: Secondary | ICD-10-CM | POA: Diagnosis not present

## 2023-09-22 LAB — POCT CBG (FASTING - GLUCOSE)-MANUAL ENTRY: Glucose Fasting, POC: 118 mg/dL — AB (ref 70–99)

## 2023-09-22 NOTE — Progress Notes (Signed)
Established Patient Office Visit  Subjective:  Patient ID: Michele Rogers, female    DOB: 1935-11-18  Age: 88 y.o. MRN: [id number]  Chief Complaint  Patient presents with   Follow-up    2 week follow up    Patient comes in with the family member to help translate, for her blood pressure follow-up.  At her last visit her blood pressure was found to be high and was advised to monitor it at home as well.  Today initial blood pressure is very high at 190 systolic.  Patient admits that she gets very nervous and anxious when she comes to the doctor's office.  At home the numbers are within normal limits. After waiting a few minutes the systolic dropped to 150s. Patient has no complaints of headaches or dizziness, no should chest pain or shortness of breath. Will continue the current medications, but will write down the home blood pressure readings and bring it next week. Will adjust medications at that time if needed. Otherwise follow-up in 1 month. Recent labs discussed-stable.    No other concerns at this time.   Past Medical History:  Diagnosis Date   Arthritis    Chest pain    Headache(784.0)    Hyperlipidemia    Hypertension     Past Surgical History:  Procedure Laterality Date   ABDOMINAL HYSTERECTOMY     complete   CHOLECYSTECTOMY     HUMERUS FRACTURE SURGERY     left arm   VAGINAL DELIVERY     5    Social History   Socioeconomic History   Marital status: Widowed    Spouse name: Not on file   Number of children: Not on file   Years of education: Not on file   Highest education level: Not on file  Occupational History   Not on file  Tobacco Use   Smoking status: Never   Smokeless tobacco: Never  Vaping Use   Vaping status: Never Used  Substance and Sexual Activity   Alcohol use: Yes    Alcohol/week: 0.0 standard drinks of alcohol    Comment: one drink every couple months   Drug use: No   Sexual activity: Not on file  Other Topics Concern   Not on  file  Social History Narrative   Lives in [place], from [place]. Lives with daughter, granddaughter. No pets.    Social Determinants of Health   Financial Resource Strain: Not on file  Food Insecurity: Not on file  Transportation Needs: Not on file  Physical Activity: Not on file  Stress: Not on file  Social Connections: Not on file  Intimate Partner Violence: Not on file    Family History  Problem Relation Age of Onset   Cancer Sister        colon    Allergies  Allergen Reactions   Lisinopril Cough    Review of Systems  Constitutional:  Positive for diaphoresis. Negative for chills, fever and weight loss.  HENT: Negative.    Eyes: Negative.  Negative for blurred vision and double vision.  Respiratory: Negative.  Negative for cough and shortness of breath.   Cardiovascular: Negative.  Negative for chest pain, palpitations and leg swelling.  Gastrointestinal: Negative.  Negative for abdominal pain, constipation, diarrhea, heartburn, nausea and vomiting.  Genitourinary: Negative.  Negative for dysuria and flank pain.  Musculoskeletal: Negative.  Negative for joint pain and myalgias.  Skin: Negative.   Neurological:  Positive for tremors. Negative for dizziness, tingling and headaches.  Endo/Heme/Allergies: Negative.   Psychiatric/Behavioral: Negative.  Negative for depression and suicidal ideas. The patient is not nervous/anxious.        Objective:   BP (!) 190/90   Pulse 74   Ht 5' (1.524 m)   Wt 133 lb 3.2 oz (60.4 kg)   SpO2 100%   BMI 26.01 kg/m   Vitals:   09/22/23 1307  BP: (!) 190/90  Pulse: 74  Height: 5' (1.524 m)  Weight: 133 lb 3.2 oz (60.4 kg)  SpO2: 100%  BMI (Calculated): 26.01    Physical Exam Vitals and nursing note reviewed.  Constitutional:      Appearance: Normal appearance.  HENT:     Head: Normocephalic and atraumatic.     Nose: Nose normal.     Mouth/Throat:     Mouth: Mucous membranes are moist.     Pharynx: Oropharynx is  clear.  Eyes:     Conjunctiva/sclera: Conjunctivae normal.     Pupils: Pupils are equal, round, and reactive to light.  Cardiovascular:     Rate and Rhythm: Normal rate and regular rhythm.     Pulses: Normal pulses.     Heart sounds: Normal heart sounds. No murmur heard. Pulmonary:     Effort: Pulmonary effort is normal.     Breath sounds: Normal breath sounds. No wheezing.  Abdominal:     General: Bowel sounds are normal.     Palpations: Abdomen is soft.     Tenderness: There is no abdominal tenderness. There is no right CVA tenderness or left CVA tenderness.  Musculoskeletal:        General: Normal range of motion.     Cervical back: Normal range of motion.     Right lower leg: No edema.     Left lower leg: No edema.  Skin:    General: Skin is warm and dry.  Neurological:     General: No focal deficit present.     Mental Status: She is alert and oriented to person, place, and time.  Psychiatric:        Mood and Affect: Mood normal.        Behavior: Behavior normal.      Results for orders placed or performed in visit on 09/22/23  POCT CBG (Fasting - Glucose)  Result Value Ref Range   Glucose Fasting, POC 118 (A) 70 - 99 mg/dL    Recent Results (from the past 2160 hour(s))  POCT CBG (Fasting - Glucose)     Status: Abnormal   Collection Time: 09/08/23  1:39 PM  Result Value Ref Range   Glucose Fasting, POC 136 (A) 70 - 99 mg/dL  [id number]     Status: Abnormal   Collection Time: 09/08/23  2:06 PM  Result Value Ref Range   Glucose 112 (H) 70 - 99 mg/dL   BUN 16 8 - 27 mg/dL   Creatinine, Ser 8.41 0.57 - 1.00 mg/dL   eGFR 58 (L) >66 AY/TKZ/6.01   BUN/Creatinine Ratio 17 12 - 28   Sodium 141 134 - 144 mmol/L   Potassium 4.7 3.5 - 5.2 mmol/L   Chloride 99 96 - 106 mmol/L   CO2 27 20 - 29 mmol/L   Calcium 9.2 8.7 - 10.3 mg/dL   Total Protein 7.3 6.0 - 8.5 g/dL   Albumin 4.4 3.7 - 4.7 g/dL   Globulin, Total 2.9 1.5 - 4.5 g/dL   Bilirubin Total 1.3 (H) 0.0 - 1.2  mg/dL   Alkaline Phosphatase 80 44 -  121 IU/L   AST 22 0 - 40 IU/L   ALT 16 0 - 32 IU/L  Hemoglobin A1c     Status: Abnormal   Collection Time: 09/08/23  2:06 PM  Result Value Ref Range   Hgb A1c MFr Bld 6.2 (H) 4.8 - 5.6 %    Comment:          Prediabetes: 5.7 - 6.4          Diabetes: >6.4          Glycemic control for adults with diabetes: <7.0    Est. average glucose Bld gHb Est-mCnc 131 mg/dL  Lipid Panel w/o Chol/HDL Ratio     Status: None   Collection Time: 09/08/23  2:06 PM  Result Value Ref Range   Cholesterol, Total 147 100 - 199 mg/dL   Triglycerides 540 0 - 149 mg/dL   HDL 59 >98 mg/dL   VLDL Cholesterol Cal 24 5 - 40 mg/dL   LDL Chol Calc (NIH) 64 0 - 99 mg/dL  POCT CBG (Fasting - Glucose)     Status: Abnormal   Collection Time: 09/22/23  1:11 PM  Result Value Ref Range   Glucose Fasting, POC 118 (A) 70 - 99 mg/dL      Assessment & Plan:  To continue current medications.  Monitor blood pressure and bring in the record.  Will decide if she needs another medicine to be added on. Problem List Items Addressed This Visit     Type 2 diabetes mellitus without complication, without long-term current use of insulin (HCC) - Primary   Relevant Orders   POCT CBG (Fasting - Glucose) (Completed)    Return in about 1 month (around 10/22/2023).   Total time spent: 30 minutes  Margaretann Loveless, MD  09/22/2023   This document may have been prepared by [facility] Voice Recognition software and as such may include unintentional dictation errors.

## 2023-10-01 ENCOUNTER — Other Ambulatory Visit (INDEPENDENT_AMBULATORY_CARE_PROVIDER_SITE_OTHER): Payer: Medicaid Other | Admitting: Internal Medicine

## 2023-10-01 DIAGNOSIS — I10 Essential (primary) hypertension: Secondary | ICD-10-CM | POA: Diagnosis not present

## 2023-10-01 MED ORDER — AMLODIPINE BESYLATE 5 MG PO TABS
5.0000 mg | ORAL_TABLET | Freq: Every day | ORAL | 11 refills | Status: DC
Start: 2023-10-01 — End: 2023-11-02

## 2023-10-01 NOTE — Progress Notes (Signed)
Home BP readings very high- Start Norvasc 5 mg /day- Continue monitoring.

## 2023-10-03 ENCOUNTER — Other Ambulatory Visit: Payer: Self-pay | Admitting: Internal Medicine

## 2023-10-03 DIAGNOSIS — I10 Essential (primary) hypertension: Secondary | ICD-10-CM

## 2023-10-26 ENCOUNTER — Ambulatory Visit: Payer: Medicaid Other | Admitting: Cardiology

## 2023-11-02 ENCOUNTER — Ambulatory Visit: Payer: Medicaid Other | Admitting: Internal Medicine

## 2023-11-02 ENCOUNTER — Encounter: Payer: Self-pay | Admitting: Internal Medicine

## 2023-11-02 VITALS — BP 140/74 | HR 68 | Ht 60.0 in | Wt 131.2 lb

## 2023-11-02 DIAGNOSIS — E782 Mixed hyperlipidemia: Secondary | ICD-10-CM | POA: Diagnosis not present

## 2023-11-02 DIAGNOSIS — I10 Essential (primary) hypertension: Secondary | ICD-10-CM

## 2023-11-02 DIAGNOSIS — M1711 Unilateral primary osteoarthritis, right knee: Secondary | ICD-10-CM

## 2023-11-02 DIAGNOSIS — I152 Hypertension secondary to endocrine disorders: Secondary | ICD-10-CM | POA: Diagnosis not present

## 2023-11-02 DIAGNOSIS — E1159 Type 2 diabetes mellitus with other circulatory complications: Secondary | ICD-10-CM | POA: Diagnosis not present

## 2023-11-02 DIAGNOSIS — E119 Type 2 diabetes mellitus without complications: Secondary | ICD-10-CM

## 2023-11-02 LAB — POCT CBG (FASTING - GLUCOSE)-MANUAL ENTRY: Glucose Fasting, POC: 130 mg/dL — AB (ref 70–99)

## 2023-11-02 MED ORDER — AMLODIPINE BESYLATE 10 MG PO TABS: 10.0000 mg | ORAL_TABLET | Freq: Every day | ORAL | 11 refills | Status: DC

## 2023-11-02 NOTE — Progress Notes (Signed)
Established Patient Office Visit  Subjective:  Patient ID: Michele Rogers, female    DOB: [birth year]  Age: 88 y.o. MRN: [id number]  Chief Complaint  Patient presents with   Follow-up    1 month follow up    Patient is accompanied by her granddaughter.  She is feeling well and offers no new complaints.  However her blood pressure is still on the high side, as well as home readings are above normal. Will increase her dose of amlodipine to 10 mg/day while continuing other medications.    No other concerns at this time.   Past Medical History:  Diagnosis Date   Arthritis    Chest pain    Headache(784.0)    Hyperlipidemia    Hypertension     Past Surgical History:  Procedure Laterality Date   ABDOMINAL HYSTERECTOMY     complete   CHOLECYSTECTOMY     HUMERUS FRACTURE SURGERY     left arm   VAGINAL DELIVERY     5    Social History   Socioeconomic History   Marital status: Widowed    Spouse name: Not on file   Number of children: Not on file   Years of education: Not on file   Highest education level: Not on file  Occupational History   Not on file  Tobacco Use   Smoking status: Never   Smokeless tobacco: Never  Vaping Use   Vaping status: Never Used  Substance and Sexual Activity   Alcohol use: Yes    Alcohol/week: 0.0 standard drinks of alcohol    Comment: one drink every couple months   Drug use: No   Sexual activity: Not on file  Other Topics Concern   Not on file  Social History Narrative   Lives in [place], from [place]. Lives with daughter, granddaughter. No pets.    Social Determinants of Health   Financial Resource Strain: Not on file  Food Insecurity: Not on file  Transportation Needs: Not on file  Physical Activity: Not on file  Stress: Not on file  Social Connections: Not on file  Intimate Partner Violence: Not on file    Family History  Problem Relation Age of Onset   Cancer Sister        colon    Allergies  Allergen  Reactions   Lisinopril Cough    Review of Systems  Constitutional: Negative.  Negative for chills, fever, malaise/fatigue and weight loss.  HENT: Negative.  Negative for congestion, hearing loss, sinus pain and sore throat.   Eyes: Negative.   Respiratory: Negative.  Negative for cough and shortness of breath.   Cardiovascular: Negative.  Negative for chest pain, palpitations and leg swelling.  Gastrointestinal: Negative.  Negative for abdominal pain, constipation, diarrhea, heartburn, nausea and vomiting.  Genitourinary: Negative.  Negative for dysuria and flank pain.  Musculoskeletal: Negative.  Negative for joint pain and myalgias.  Skin: Negative.   Neurological: Negative.  Negative for dizziness and headaches.  Endo/Heme/Allergies: Negative.   Psychiatric/Behavioral: Negative.  Negative for depression and suicidal ideas. The patient is not nervous/anxious.        Objective:   BP (!) 140/74   Pulse 68   Ht 5' (1.524 m)   Wt 131 lb 3.2 oz (59.5 kg)   SpO2 99%   BMI 25.62 kg/m   Vitals:   11/02/23 1353  BP: (!) 140/74  Pulse: 68  Height: 5' (1.524 m)  Weight: 131 lb 3.2 oz (59.5 kg)  SpO2:  99%  BMI (Calculated): 25.62    Physical Exam Vitals and nursing note reviewed.  Constitutional:      Appearance: Normal appearance.  HENT:     Head: Normocephalic and atraumatic.     Nose: Nose normal.     Mouth/Throat:     Mouth: Mucous membranes are moist.     Pharynx: Oropharynx is clear.  Eyes:     Conjunctiva/sclera: Conjunctivae normal.     Pupils: Pupils are equal, round, and reactive to light.  Cardiovascular:     Rate and Rhythm: Normal rate and regular rhythm.     Pulses: Normal pulses.     Heart sounds: Normal heart sounds. No murmur heard. Pulmonary:     Effort: Pulmonary effort is normal.     Breath sounds: Normal breath sounds. No wheezing.  Abdominal:     General: Bowel sounds are normal.     Palpations: Abdomen is soft.     Tenderness: There is no  abdominal tenderness. There is no right CVA tenderness or left CVA tenderness.  Musculoskeletal:        General: Normal range of motion.     Cervical back: Normal range of motion.     Right lower leg: No edema.     Left lower leg: No edema.  Skin:    General: Skin is warm and dry.  Neurological:     General: No focal deficit present.     Mental Status: She is alert and oriented to person, place, and time.  Psychiatric:        Mood and Affect: Mood normal.        Behavior: Behavior normal.      Results for orders placed or performed in visit on 11/02/23  POCT CBG (Fasting - Glucose)  Result Value Ref Range   Glucose Fasting, POC 130 (A) 70 - 99 mg/dL    Recent Results (from the past 2160 hour(s))  POCT CBG (Fasting - Glucose)     Status: Abnormal   Collection Time: 09/08/23  1:39 PM  Result Value Ref Range   Glucose Fasting, POC 136 (A) 70 - 99 mg/dL  [id number]     Status: Abnormal   Collection Time: 09/08/23  2:06 PM  Result Value Ref Range   Glucose 112 (H) 70 - 99 mg/dL   BUN 16 8 - 27 mg/dL   Creatinine, Ser 3.32 0.57 - 1.00 mg/dL   eGFR 58 (L) >95 JO/ACZ/6.60   BUN/Creatinine Ratio 17 12 - 28   Sodium 141 134 - 144 mmol/L   Potassium 4.7 3.5 - 5.2 mmol/L   Chloride 99 96 - 106 mmol/L   CO2 27 20 - 29 mmol/L   Calcium 9.2 8.7 - 10.3 mg/dL   Total Protein 7.3 6.0 - 8.5 g/dL   Albumin 4.4 3.7 - 4.7 g/dL   Globulin, Total 2.9 1.5 - 4.5 g/dL   Bilirubin Total 1.3 (H) 0.0 - 1.2 mg/dL   Alkaline Phosphatase 80 44 - 121 IU/L   AST 22 0 - 40 IU/L   ALT 16 0 - 32 IU/L  Hemoglobin A1c     Status: Abnormal   Collection Time: 09/08/23  2:06 PM  Result Value Ref Range   Hgb A1c MFr Bld 6.2 (H) 4.8 - 5.6 %    Comment:          Prediabetes: 5.7 - 6.4          Diabetes: >6.4  Glycemic control for adults with diabetes: <7.0    Est. average glucose Bld gHb Est-mCnc 131 mg/dL  Lipid Panel w/o Chol/HDL Ratio     Status: None   Collection Time: 09/08/23  2:06 PM   Result Value Ref Range   Cholesterol, Total 147 100 - 199 mg/dL   Triglycerides 161 0 - 149 mg/dL   HDL 59 >09 mg/dL   VLDL Cholesterol Cal 24 5 - 40 mg/dL   LDL Chol Calc (NIH) 64 0 - 99 mg/dL  POCT CBG (Fasting - Glucose)     Status: Abnormal   Collection Time: 09/22/23  1:11 PM  Result Value Ref Range   Glucose Fasting, POC 118 (A) 70 - 99 mg/dL  POCT CBG (Fasting - Glucose)     Status: Abnormal   Collection Time: 11/02/23  1:57 PM  Result Value Ref Range   Glucose Fasting, POC 130 (A) 70 - 99 mg/dL      Assessment & Plan:  Patient advised to increase the dose of Norvasc to 10 mg/day. Continue to monitor blood pressure at home.  Watch out for ankle and leg edema Problem List Items Addressed This Visit     Hyperlipidemia   Relevant Medications   amLODipine (NORVASC) 10 MG tablet   Essential hypertension, benign   Relevant Medications   amLODipine (NORVASC) 10 MG tablet   Primary osteoarthritis of right knee   Type 2 diabetes mellitus without complication, without long-term current use of insulin (HCC)   Relevant Orders   POCT CBG (Fasting - Glucose) (Completed)   Other Visit Diagnoses     Hypertension associated with diabetes (HCC)    -  Primary   Relevant Medications   amLODipine (NORVASC) 10 MG tablet       Return in about 1 month (around 12/02/2023).   Total time spent: 30 minutes  Margaretann Loveless, MD  11/02/2023   This document may have been prepared by [facility] Voice Recognition software and as such may include unintentional dictation errors.

## 2023-12-14 ENCOUNTER — Ambulatory Visit: Payer: Medicaid Other | Admitting: Internal Medicine

## 2023-12-14 ENCOUNTER — Encounter: Payer: Self-pay | Admitting: Internal Medicine

## 2023-12-14 VITALS — BP 130/70 | HR 66 | Ht 60.0 in | Wt 128.0 lb

## 2023-12-14 DIAGNOSIS — I152 Hypertension secondary to endocrine disorders: Secondary | ICD-10-CM | POA: Diagnosis not present

## 2023-12-14 DIAGNOSIS — E119 Type 2 diabetes mellitus without complications: Secondary | ICD-10-CM | POA: Diagnosis not present

## 2023-12-14 DIAGNOSIS — E1159 Type 2 diabetes mellitus with other circulatory complications: Secondary | ICD-10-CM

## 2023-12-14 DIAGNOSIS — M1711 Unilateral primary osteoarthritis, right knee: Secondary | ICD-10-CM | POA: Diagnosis not present

## 2023-12-14 DIAGNOSIS — E782 Mixed hyperlipidemia: Secondary | ICD-10-CM

## 2023-12-14 DIAGNOSIS — K219 Gastro-esophageal reflux disease without esophagitis: Secondary | ICD-10-CM | POA: Diagnosis not present

## 2023-12-14 LAB — GLUCOSE, POCT (MANUAL RESULT ENTRY): POC Glucose: 99 mg/dL (ref 70–99)

## 2023-12-14 NOTE — Progress Notes (Signed)
Established Patient Office Visit  Subjective:  Patient ID: Michele Rogers, female    DOB: [birth year]  Age: 88 y.o. MRN: [id number]  Chief Complaint  Patient presents with   Follow-up    1 mo    Patient comes in for her follow-up today.  She is accompanied by her family member who helps translate.  Her blood pressure is looking much better since the dose of Norvasc was increased to 10 mg/day.  She is tolerating the medicine well and does not have any bilateral ankle swelling.  She also denies chest pains, no palpitations, no shortness of breath. Patient will return fasting for blood work.    No other concerns at this time.   Past Medical History:  Diagnosis Date   Arthritis    Chest pain    Headache(784.0)    Hyperlipidemia    Hypertension     Past Surgical History:  Procedure Laterality Date   ABDOMINAL HYSTERECTOMY     complete   CHOLECYSTECTOMY     HUMERUS FRACTURE SURGERY     left arm   VAGINAL DELIVERY     5    Social History   Socioeconomic History   Marital status: Widowed    Spouse name: Not on file   Number of children: Not on file   Years of education: Not on file   Highest education level: Not on file  Occupational History   Not on file  Tobacco Use   Smoking status: Never   Smokeless tobacco: Never  Vaping Use   Vaping status: Never Used  Substance and Sexual Activity   Alcohol use: Yes    Alcohol/week: 0.0 standard drinks of alcohol    Comment: one drink every couple months   Drug use: No   Sexual activity: Not on file  Other Topics Concern   Not on file  Social History Narrative   Lives in [place], from [place]. Lives with daughter, granddaughter. No pets.    Social Drivers of Corporate investment banker Strain: Not on file  Food Insecurity: Not on file  Transportation Needs: Not on file  Physical Activity: Not on file  Stress: Not on file  Social Connections: Not on file  Intimate Partner Violence: Not on file    Family  History  Problem Relation Age of Onset   Cancer Sister        colon    Allergies  Allergen Reactions   Lisinopril Cough    Outpatient Medications Prior to Visit  Medication Sig   alendronate (FOSAMAX) 70 MG tablet ONCE A WEEK   amLODipine (NORVASC) 10 MG tablet Take 1 tablet (10 mg total) by mouth daily.   atorvastatin (LIPITOR) 40 MG tablet TOME UNA TABLETA TODOS LOS DIAS   Cholecalciferol 50 MCG (2000 UT) CAPS Take 2,000 Units by mouth daily.   docusate sodium (COLACE) 100 MG capsule Take 1 capsule (100 mg total) by mouth 2 (two) times daily.   hydrochlorothiazide (HYDRODIURIL) 25 MG tablet TOME UNA TABLETA TODOS LOS DIAS   losartan (COZAAR) 100 MG tablet TOME UNA TABLETA TODOS LOS DIAS   meloxicam (MOBIC) 7.5 MG tablet TOME 1 TABLETA POR VIA ORAL DOS VECES AL DIA   metFORMIN (GLUCOPHAGE-XR) 500 MG 24 hr tablet TOME UNA TABLETA TODOS LOS DIAS   metoprolol tartrate (LOPRESSOR) 100 MG tablet TOME 1 TABLETA POR VIA ORAL DOS VECES AL DIA   OS-CAL CALCIUM + D3 500-200 MG-UNIT TABS TOME UNA TABLETA DOS VECES AL D?A  pantoprazole (PROTONIX) 40 MG tablet Take 1 tablet (40 mg total) by mouth daily.   polyethylene glycol powder (GLYCOLAX/MIRALAX) 17 GM/SCOOP powder Take 17 g by mouth daily.   potassium chloride (KLOR-CON) 10 MEQ tablet TOME UNA TABLETA TODOS LOS DIAS   No facility-administered medications prior to visit.    Review of Systems  Constitutional: Negative.  Negative for chills, fever, malaise/fatigue and weight loss.  HENT: Negative.  Negative for sinus pain and tinnitus.   Eyes: Negative.   Respiratory: Negative.  Negative for cough and shortness of breath.   Cardiovascular: Negative.  Negative for chest pain, palpitations and leg swelling.  Gastrointestinal: Negative.  Negative for abdominal pain, constipation, diarrhea, heartburn, nausea and vomiting.  Genitourinary: Negative.  Negative for dysuria and flank pain.  Musculoskeletal:  Positive for joint pain. Negative for  myalgias.  Skin: Negative.   Neurological: Negative.  Negative for dizziness and headaches.  Endo/Heme/Allergies: Negative.   Psychiatric/Behavioral: Negative.  Negative for depression and suicidal ideas. The patient is not nervous/anxious.        Objective:   BP 130/70   Pulse 66   Wt 128 lb (58.1 kg)   SpO2 95%   BMI 25.00 kg/m   Vitals:   12/14/23 1310  BP: 130/70  Pulse: 66  Weight: 128 lb (58.1 kg)  SpO2: 95%    Physical Exam Vitals and nursing note reviewed.  Constitutional:      Appearance: Normal appearance.  HENT:     Head: Normocephalic and atraumatic.     Nose: Nose normal.     Mouth/Throat:     Mouth: Mucous membranes are moist.     Pharynx: Oropharynx is clear.  Eyes:     Conjunctiva/sclera: Conjunctivae normal.     Pupils: Pupils are equal, round, and reactive to light.  Cardiovascular:     Rate and Rhythm: Normal rate and regular rhythm.     Pulses: Normal pulses.     Heart sounds: Normal heart sounds. No murmur heard. Pulmonary:     Effort: Pulmonary effort is normal.     Breath sounds: Normal breath sounds. No wheezing.  Abdominal:     General: Bowel sounds are normal.     Palpations: Abdomen is soft.     Tenderness: There is no abdominal tenderness. There is no right CVA tenderness or left CVA tenderness.  Musculoskeletal:        General: Normal range of motion.     Cervical back: Normal range of motion.     Right lower leg: No edema.     Left lower leg: No edema.  Skin:    General: Skin is warm and dry.  Neurological:     General: No focal deficit present.     Mental Status: She is alert and oriented to person, place, and time.  Psychiatric:        Mood and Affect: Mood normal.        Behavior: Behavior normal.      Results for orders placed or performed in visit on 12/14/23  POCT Glucose (CBG)  Result Value Ref Range   POC Glucose 99 70 - 99 mg/dl    Recent Results (from the past 2160 hours)  POCT CBG (Fasting - Glucose)      Status: Abnormal   Collection Time: 09/22/23  1:11 PM  Result Value Ref Range   Glucose Fasting, POC 118 (A) 70 - 99 mg/dL  POCT CBG (Fasting - Glucose)     Status: Abnormal  Collection Time: 11/02/23  1:57 PM  Result Value Ref Range   Glucose Fasting, POC 130 (A) 70 - 99 mg/dL  POCT Glucose (CBG)     Status: Normal   Collection Time: 12/14/23  1:13 PM  Result Value Ref Range   POC Glucose 99 70 - 99 mg/dl      Assessment & Plan:  Patient will continue taking all the medications.  Monitor blood pressure and glucose at home. Problem List Items Addressed This Visit     Hyperlipidemia   Relevant Orders   Lipid Panel w/o Chol/HDL Ratio   Hypertension associated with diabetes (HCC) - Primary   Relevant Orders   CMP14+EGFR   GERD (gastroesophageal reflux disease)   Relevant Orders   CBC with Diff   Primary osteoarthritis of right knee   Type 2 diabetes mellitus without complication, without long-term current use of insulin (HCC)   Relevant Orders   POCT Glucose (CBG) (Completed)   Hemoglobin A1c    Return in about 3 months (around 03/13/2024).   Total time spent: 30 minutes  Margaretann Loveless, MD  12/14/2023   This document may have been prepared by [facility] Voice Recognition software and as such may include unintentional dictation errors.

## 2023-12-15 LAB — CBC WITH DIFFERENTIAL/PLATELET
Basophils Absolute: 0 10*3/uL (ref 0.0–0.2)
Basos: 1 %
EOS (ABSOLUTE): 0.1 10*3/uL (ref 0.0–0.4)
Eos: 2 %
Hematocrit: 37.2 % (ref 34.0–46.6)
Hemoglobin: 12.3 g/dL (ref 11.1–15.9)
Immature Grans (Abs): 0 10*3/uL (ref 0.0–0.1)
Immature Granulocytes: 0 %
Lymphocytes Absolute: 2.2 10*3/uL (ref 0.7–3.1)
Lymphs: 38 %
MCH: 32.4 pg (ref 26.6–33.0)
MCHC: 33.1 g/dL (ref 31.5–35.7)
MCV: 98 fL — ABNORMAL HIGH (ref 79–97)
Monocytes Absolute: 0.4 10*3/uL (ref 0.1–0.9)
Monocytes: 7 %
Neutrophils Absolute: 3 10*3/uL (ref 1.4–7.0)
Neutrophils: 52 %
Platelets: 220 10*3/uL (ref 150–450)
RBC: 3.8 x10E6/uL (ref 3.77–5.28)
RDW: 12.4 % (ref 11.7–15.4)
WBC: 5.8 10*3/uL (ref 3.4–10.8)

## 2023-12-15 LAB — CMP14+EGFR
ALT: 14 [IU]/L (ref 0–32)
AST: 25 [IU]/L (ref 0–40)
Albumin: 4.2 g/dL (ref 3.7–4.7)
Alkaline Phosphatase: 90 [IU]/L (ref 44–121)
BUN/Creatinine Ratio: 13 (ref 12–28)
BUN: 13 mg/dL (ref 8–27)
Bilirubin Total: 1.5 mg/dL — ABNORMAL HIGH (ref 0.0–1.2)
CO2: 24 mmol/L (ref 20–29)
Calcium: 9.9 mg/dL (ref 8.7–10.3)
Chloride: 100 mmol/L (ref 96–106)
Creatinine, Ser: 1 mg/dL (ref 0.57–1.00)
Globulin, Total: 3.1 g/dL (ref 1.5–4.5)
Glucose: 101 mg/dL — ABNORMAL HIGH (ref 70–99)
Potassium: 5.3 mmol/L — ABNORMAL HIGH (ref 3.5–5.2)
Sodium: 141 mmol/L (ref 134–144)
Total Protein: 7.3 g/dL (ref 6.0–8.5)
eGFR: 55 mL/min/{1.73_m2} — ABNORMAL LOW (ref >59)

## 2023-12-15 LAB — LIPID PANEL W/O CHOL/HDL RATIO
Cholesterol, Total: 151 mg/dL (ref 100–199)
HDL: 65 mg/dL (ref >39)
LDL Chol Calc (NIH): 62 mg/dL (ref 0–99)
Triglycerides: 140 mg/dL (ref 0–149)
VLDL Cholesterol Cal: 24 mg/dL (ref 5–40)

## 2023-12-15 LAB — HEMOGLOBIN A1C
Est. average glucose Bld gHb Est-mCnc: 131 mg/dL
Hgb A1c MFr Bld: 6.2 % — ABNORMAL HIGH (ref 4.8–5.6)

## 2024-01-10 ENCOUNTER — Other Ambulatory Visit: Payer: Self-pay | Admitting: Internal Medicine

## 2024-01-10 DIAGNOSIS — M25561 Pain in right knee: Secondary | ICD-10-CM

## 2024-01-11 ENCOUNTER — Telehealth: Payer: Self-pay | Admitting: Internal Medicine

## 2024-01-11 DIAGNOSIS — M25561 Pain in right knee: Secondary | ICD-10-CM

## 2024-01-11 DIAGNOSIS — M810 Age-related osteoporosis without current pathological fracture: Secondary | ICD-10-CM

## 2024-01-11 DIAGNOSIS — E785 Hyperlipidemia, unspecified: Secondary | ICD-10-CM

## 2024-01-11 DIAGNOSIS — I10 Essential (primary) hypertension: Secondary | ICD-10-CM

## 2024-01-11 DIAGNOSIS — G250 Essential tremor: Secondary | ICD-10-CM

## 2024-01-11 NOTE — Telephone Encounter (Signed)
 Patient's granddaughter left VM requesting 59 DS of all patient's meds be sent to pharmacy because the patient is going to [place] for a family emergency. Please advise.

## 2024-01-12 MED ORDER — HYDROCHLOROTHIAZIDE 25 MG PO TABS: ORAL_TABLET | ORAL | 3 refills | Status: AC

## 2024-01-12 MED ORDER — AMLODIPINE BESYLATE 10 MG PO TABS: 10.0000 mg | ORAL_TABLET | Freq: Every day | ORAL | 3 refills | Status: AC

## 2024-01-12 MED ORDER — METOPROLOL TARTRATE 100 MG PO TABS: 100.0000 mg | ORAL_TABLET | Freq: Two times a day (BID) | ORAL | 3 refills | Status: AC

## 2024-01-12 MED ORDER — METFORMIN HCL ER 500 MG PO TB24: 500.0000 mg | ORAL_TABLET | Freq: Every day | ORAL | 3 refills | Status: AC

## 2024-01-12 MED ORDER — MELOXICAM 7.5 MG PO TABS: 7.5000 mg | ORAL_TABLET | Freq: Every day | ORAL | 3 refills | Status: DC

## 2024-01-12 MED ORDER — ALENDRONATE SODIUM 70 MG PO TABS: 70.0000 mg | ORAL_TABLET | ORAL | 3 refills | Status: AC

## 2024-01-12 MED ORDER — LOSARTAN POTASSIUM 100 MG PO TABS: ORAL_TABLET | ORAL | 3 refills | Status: AC

## 2024-01-12 MED ORDER — ATORVASTATIN CALCIUM 40 MG PO TABS: 40.0000 mg | ORAL_TABLET | Freq: Every day | ORAL | 3 refills | Status: AC

## 2024-02-09 ENCOUNTER — Other Ambulatory Visit: Payer: Self-pay | Admitting: Internal Medicine

## 2024-02-09 DIAGNOSIS — M25561 Pain in right knee: Secondary | ICD-10-CM

## 2024-03-14 ENCOUNTER — Ambulatory Visit: Payer: Medicaid Other | Admitting: Internal Medicine

## 2024-05-12 ENCOUNTER — Other Ambulatory Visit: Payer: Self-pay | Admitting: Internal Medicine

## 2024-05-12 DIAGNOSIS — I10 Essential (primary) hypertension: Secondary | ICD-10-CM

## 2024-05-12 DIAGNOSIS — E876 Hypokalemia: Secondary | ICD-10-CM

## 2024-05-24 ENCOUNTER — Ambulatory Visit: Admitting: Internal Medicine

## 2024-06-02 ENCOUNTER — Encounter: Payer: Self-pay | Admitting: Internal Medicine

## 2024-06-02 ENCOUNTER — Ambulatory Visit: Payer: Self-pay | Admitting: Internal Medicine

## 2024-06-02 ENCOUNTER — Ambulatory Visit (INDEPENDENT_AMBULATORY_CARE_PROVIDER_SITE_OTHER): Admitting: Internal Medicine

## 2024-06-02 VITALS — BP 124/62 | HR 86 | Ht 60.0 in | Wt 125.8 lb

## 2024-06-02 DIAGNOSIS — I152 Hypertension secondary to endocrine disorders: Secondary | ICD-10-CM | POA: Diagnosis not present

## 2024-06-02 DIAGNOSIS — E782 Mixed hyperlipidemia: Secondary | ICD-10-CM | POA: Diagnosis not present

## 2024-06-02 DIAGNOSIS — E119 Type 2 diabetes mellitus without complications: Secondary | ICD-10-CM

## 2024-06-02 DIAGNOSIS — E1169 Type 2 diabetes mellitus with other specified complication: Secondary | ICD-10-CM

## 2024-06-02 DIAGNOSIS — E1159 Type 2 diabetes mellitus with other circulatory complications: Secondary | ICD-10-CM

## 2024-06-02 DIAGNOSIS — K219 Gastro-esophageal reflux disease without esophagitis: Secondary | ICD-10-CM

## 2024-06-02 LAB — POCT CBG (FASTING - GLUCOSE)-MANUAL ENTRY: Glucose Fasting, POC: 163 mg/dL — AB (ref 70–99)

## 2024-06-02 NOTE — Progress Notes (Signed)
 Established Patient Office Visit  Subjective:  Patient ID: Michele Rogers, female    DOB: 12-21-1935  Age: 89 y.o. MRN: [id number]  Chief Complaint  Patient presents with   Follow-up    Patient comes in for her 12-month follow-up accompanied by her family member.  She had been traveling to [place].  Currently feels well and has no new complaints.  However she has lost some weight but admits to having less appetite than before.  She eats very small portions.  Patient encouraged to drink Ensure whenever she is not very hungry to eat.  Encouraged to drink plenty of water.  Will check labs today.  Denies nausea or vomiting, no abdominal pain, no constipation and no diarrhea.    No other concerns at this time.   Past Medical History:  Diagnosis Date   Arthritis    Chest pain    Headache(784.0)    Hyperlipidemia    Hypertension     Past Surgical History:  Procedure Laterality Date   ABDOMINAL HYSTERECTOMY     complete   CHOLECYSTECTOMY     HUMERUS FRACTURE SURGERY     left arm   VAGINAL DELIVERY     5    Social History   Socioeconomic History   Marital status: Widowed    Spouse name: Not on file   Number of children: Not on file   Years of education: Not on file   Highest education level: Not on file  Occupational History   Not on file  Tobacco Use   Smoking status: Never   Smokeless tobacco: Never  Vaping Use   Vaping status: Never Used  Substance and Sexual Activity   Alcohol use: Yes    Alcohol/week: 0.0 standard drinks of alcohol    Comment: one drink every couple months   Drug use: No   Sexual activity: Not on file  Other Topics Concern   Not on file  Social History Narrative   Lives in [place], from [place]. Lives with daughter, granddaughter. No pets.    Social Drivers of Corporate investment banker Strain: Not on file  Food Insecurity: Not on file  Transportation Needs: Not on file  Physical Activity: Not on file  Stress: Not on file  Social  Connections: Not on file  Intimate Partner Violence: Not on file    Family History  Problem Relation Age of Onset   Cancer Sister        colon    Allergies  Allergen Reactions   Lisinopril  Cough    Outpatient Medications Prior to Visit  Medication Sig   alendronate  (FOSAMAX ) 70 MG tablet Take 1 tablet (70 mg total) by mouth once a week.   amLODipine  (NORVASC ) 10 MG tablet Take 1 tablet (10 mg total) by mouth daily.   atorvastatin  (LIPITOR) 40 MG tablet Take 1 tablet (40 mg total) by mouth daily.   Cholecalciferol 50 MCG (2000 UT) CAPS Take 2,000 Units by mouth daily.   docusate sodium  (COLACE) 100 MG capsule Take 1 capsule (100 mg total) by mouth 2 (two) times daily.   hydrochlorothiazide  (HYDRODIURIL ) 25 MG tablet TOME UNA TABLETA TODOS LOS DIAS   losartan  (COZAAR ) 100 MG tablet TOME UNA TABLETA TODOS LOS DIAS   meloxicam  (MOBIC ) 7.5 MG tablet TOME 1 TABLETA POR VIA ORAL DOS VECES AL DIA   metFORMIN  (GLUCOPHAGE -XR) 500 MG 24 hr tablet Take 1 tablet (500 mg total) by mouth daily with breakfast.   metoprolol  tartrate (LOPRESSOR ) 100  MG tablet Take 1 tablet (100 mg total) by mouth 2 (two) times daily.   OS-CAL CALCIUM  + D3 500-200 MG-UNIT TABS TOME UNA TABLETA DOS VECES AL D?A   pantoprazole  (PROTONIX ) 40 MG tablet Take 1 tablet (40 mg total) by mouth daily.   polyethylene glycol powder (GLYCOLAX /MIRALAX ) 17 GM/SCOOP powder Take 17 g by mouth daily.   potassium chloride  (KLOR-CON ) 10 MEQ tablet TOME 1 TABLETA POR VIA ORAL TODOS LOS DIAS   No facility-administered medications prior to visit.    Review of Systems  Constitutional:  Positive for weight loss. Negative for chills, diaphoresis, fever and malaise/fatigue.  HENT: Negative.  Negative for congestion.   Eyes: Negative.   Respiratory: Negative.  Negative for cough and shortness of breath.   Cardiovascular: Negative.  Negative for chest pain, palpitations and leg swelling.  Gastrointestinal: Negative.  Negative for abdominal  pain, constipation, diarrhea, heartburn, nausea and vomiting.  Genitourinary: Negative.  Negative for dysuria and flank pain.  Musculoskeletal: Negative.  Negative for joint pain and myalgias.  Skin: Negative.   Neurological: Negative.  Negative for dizziness, tingling, tremors and headaches.  Endo/Heme/Allergies: Negative.   Psychiatric/Behavioral: Negative.  Negative for depression and suicidal ideas. The patient is not nervous/anxious.        Objective:   BP 124/62   Pulse 86   Ht 5' (1.524 m)   Wt 125 lb 12.8 oz (57.1 kg)   SpO2 98%   BMI 24.57 kg/m   Vitals:   06/02/24 1450  BP: 124/62  Pulse: 86  Height: 5' (1.524 m)  Weight: 125 lb 12.8 oz (57.1 kg)  SpO2: 98%  BMI (Calculated): 24.57    Physical Exam Vitals and nursing note reviewed.  Constitutional:      Appearance: Normal appearance.  HENT:     Head: Normocephalic and atraumatic.     Nose: Nose normal.     Mouth/Throat:     Mouth: Mucous membranes are moist.     Pharynx: Oropharynx is clear.  Eyes:     Conjunctiva/sclera: Conjunctivae normal.     Pupils: Pupils are equal, round, and reactive to light.  Cardiovascular:     Rate and Rhythm: Normal rate and regular rhythm.     Pulses: Normal pulses.     Heart sounds: Normal heart sounds. No murmur heard. Pulmonary:     Effort: Pulmonary effort is normal.     Breath sounds: Normal breath sounds. No wheezing.  Abdominal:     General: Bowel sounds are normal.     Palpations: Abdomen is soft.     Tenderness: There is no abdominal tenderness. There is no right CVA tenderness or left CVA tenderness.  Musculoskeletal:        General: Normal range of motion.     Cervical back: Normal range of motion.     Right lower leg: No edema.     Left lower leg: No edema.  Skin:    General: Skin is warm and dry.  Neurological:     General: No focal deficit present.     Mental Status: She is alert and oriented to person, place, and time.  Psychiatric:        Mood  and Affect: Mood normal.        Behavior: Behavior normal.      Results for orders placed or performed in visit on 06/02/24  POCT CBG (Fasting - Glucose)  Result Value Ref Range   Glucose Fasting, POC 163 (A) 70 - 99 mg/dL  Recent Results (from the past 2160 hours)  POCT CBG (Fasting - Glucose)     Status: Abnormal   Collection Time: 06/02/24  2:55 PM  Result Value Ref Range   Glucose Fasting, POC 163 (A) 70 - 99 mg/dL      Assessment & Plan:  Continue current medications.  Check labs today. Problem List Items Addressed This Visit     Hypertension associated with diabetes (HCC)   Relevant Orders   CMP14+EGFR   GERD (gastroesophageal reflux disease)   Relevant Orders   CBC with Diff   Type 2 diabetes mellitus without complication, without long-term current use of insulin (HCC) - Primary   Relevant Orders   POCT CBG (Fasting - Glucose) (Completed)   Hemoglobin A1c   Other Visit Diagnoses       Combined hyperlipidemia associated with type 2 diabetes mellitus (HCC)       Relevant Orders   Lipid Panel w/o Chol/HDL Ratio       Return in about 6 months (around 12/02/2024).   Total time spent: 30 minutes  Aisha Hove, MD  06/02/2024   This document may have been prepared by [facility] Voice Recognition software and as such may include unintentional dictation errors.

## 2024-06-14 ENCOUNTER — Other Ambulatory Visit

## 2024-06-14 DIAGNOSIS — E1159 Type 2 diabetes mellitus with other circulatory complications: Secondary | ICD-10-CM | POA: Diagnosis not present

## 2024-06-14 DIAGNOSIS — I152 Hypertension secondary to endocrine disorders: Secondary | ICD-10-CM | POA: Diagnosis not present

## 2024-06-14 DIAGNOSIS — E1169 Type 2 diabetes mellitus with other specified complication: Secondary | ICD-10-CM | POA: Diagnosis not present

## 2024-06-14 DIAGNOSIS — E782 Mixed hyperlipidemia: Secondary | ICD-10-CM | POA: Diagnosis not present

## 2024-06-14 DIAGNOSIS — E119 Type 2 diabetes mellitus without complications: Secondary | ICD-10-CM | POA: Diagnosis not present

## 2024-06-14 DIAGNOSIS — K219 Gastro-esophageal reflux disease without esophagitis: Secondary | ICD-10-CM | POA: Diagnosis not present

## 2024-06-15 LAB — LIPID PANEL W/O CHOL/HDL RATIO
Cholesterol, Total: 168 mg/dL (ref 100–199)
HDL: 62 mg/dL (ref >39)
LDL Chol Calc (NIH): 86 mg/dL (ref 0–99)
Triglycerides: 113 mg/dL (ref 0–149)
VLDL Cholesterol Cal: 20 mg/dL (ref 5–40)

## 2024-06-15 LAB — CMP14+EGFR
ALT: 15 IU/L (ref 0–32)
AST: 22 IU/L (ref 0–40)
Albumin: 3.8 g/dL (ref 3.7–4.7)
Alkaline Phosphatase: 76 IU/L (ref 44–121)
BUN/Creatinine Ratio: 17 (ref 12–28)
BUN: 17 mg/dL (ref 8–27)
Bilirubin Total: 1.8 mg/dL — ABNORMAL HIGH (ref 0.0–1.2)
CO2: 24 mmol/L (ref 20–29)
Calcium: 9.7 mg/dL (ref 8.7–10.3)
Chloride: 101 mmol/L (ref 96–106)
Creatinine, Ser: 1.03 mg/dL — ABNORMAL HIGH (ref 0.57–1.00)
Globulin, Total: 2.9 g/dL (ref 1.5–4.5)
Glucose: 103 mg/dL — ABNORMAL HIGH (ref 70–99)
Potassium: 4.6 mmol/L (ref 3.5–5.2)
Sodium: 140 mmol/L (ref 134–144)
Total Protein: 6.7 g/dL (ref 6.0–8.5)
eGFR: 53 mL/min/{1.73_m2} — ABNORMAL LOW (ref >59)

## 2024-06-15 LAB — CBC WITH DIFFERENTIAL/PLATELET
Basophils Absolute: 0 10*3/uL (ref 0.0–0.2)
Basos: 0 %
EOS (ABSOLUTE): 0.1 10*3/uL (ref 0.0–0.4)
Eos: 1 %
Hematocrit: 37.5 % (ref 34.0–46.6)
Hemoglobin: 12.3 g/dL (ref 11.1–15.9)
Immature Grans (Abs): 0 10*3/uL (ref 0.0–0.1)
Immature Granulocytes: 0 %
Lymphocytes Absolute: 2.1 10*3/uL (ref 0.7–3.1)
Lymphs: 40 %
MCH: 32.7 pg (ref 26.6–33.0)
MCHC: 32.8 g/dL (ref 31.5–35.7)
MCV: 100 fL — ABNORMAL HIGH (ref 79–97)
Monocytes Absolute: 0.4 10*3/uL (ref 0.1–0.9)
Monocytes: 8 %
Neutrophils Absolute: 2.7 10*3/uL (ref 1.4–7.0)
Neutrophils: 51 %
Platelets: 250 10*3/uL (ref 150–450)
RBC: 3.76 x10E6/uL — ABNORMAL LOW (ref 3.77–5.28)
RDW: 13 % (ref 11.7–15.4)
WBC: 5.3 10*3/uL (ref 3.4–10.8)

## 2024-06-15 LAB — HEMOGLOBIN A1C
Est. average glucose Bld gHb Est-mCnc: 134 mg/dL
Hgb A1c MFr Bld: 6.3 % — ABNORMAL HIGH (ref 4.8–5.6)

## 2024-12-01 ENCOUNTER — Ambulatory Visit: Admitting: Internal Medicine
